# Patient Record
Sex: Female | Born: 1970 | Hispanic: Yes | Smoking: Former smoker
Health system: Southern US, Community
[De-identification: ages and names within clinical notes are randomized; demographics above are authoritative.]

## PROBLEM LIST (undated history)

## (undated) DIAGNOSIS — G43909 Migraine, unspecified, not intractable, without status migrainosus: Secondary | ICD-10-CM

## (undated) DIAGNOSIS — E282 Polycystic ovarian syndrome: Secondary | ICD-10-CM

## (undated) DIAGNOSIS — E785 Hyperlipidemia, unspecified: Secondary | ICD-10-CM

## (undated) DIAGNOSIS — J189 Pneumonia, unspecified organism: Secondary | ICD-10-CM

## (undated) DIAGNOSIS — K760 Fatty (change of) liver, not elsewhere classified: Secondary | ICD-10-CM

## (undated) DIAGNOSIS — K219 Gastro-esophageal reflux disease without esophagitis: Secondary | ICD-10-CM

## (undated) DIAGNOSIS — D649 Anemia, unspecified: Secondary | ICD-10-CM

## (undated) DIAGNOSIS — M1990 Unspecified osteoarthritis, unspecified site: Secondary | ICD-10-CM

## (undated) DIAGNOSIS — K759 Inflammatory liver disease, unspecified: Secondary | ICD-10-CM

## (undated) HISTORY — DX: Anemia, unspecified: D64.9

## (undated) HISTORY — DX: Gastro-esophageal reflux disease without esophagitis: K21.9

## (undated) HISTORY — DX: Unspecified osteoarthritis, unspecified site: M19.90

## (undated) HISTORY — DX: Hyperlipidemia, unspecified: E78.5

## (undated) HISTORY — PX: BREAST BIOPSY: SHX20

## (undated) HISTORY — DX: Migraine, unspecified, not intractable, without status migrainosus: G43.909

## (undated) HISTORY — PX: BREAST EXCISIONAL BIOPSY: SUR124

## (undated) HISTORY — PX: TUBAL LIGATION: SHX77

## (undated) HISTORY — PX: SALPINGECTOMY: SHX328

---

## 2007-06-08 DIAGNOSIS — A159 Respiratory tuberculosis unspecified: Secondary | ICD-10-CM

## 2007-06-08 HISTORY — DX: Respiratory tuberculosis unspecified: A15.9

## 2011-03-17 ENCOUNTER — Other Ambulatory Visit: Payer: Self-pay | Admitting: Family Medicine

## 2011-03-17 DIAGNOSIS — D249 Benign neoplasm of unspecified breast: Secondary | ICD-10-CM

## 2011-03-17 DIAGNOSIS — N63 Unspecified lump in unspecified breast: Secondary | ICD-10-CM

## 2011-03-31 ENCOUNTER — Ambulatory Visit
Admission: RE | Admit: 2011-03-31 | Discharge: 2011-03-31 | Disposition: A | Discharge status: Home / Self Care | Payer: BC Managed Care – PPO | Source: Ambulatory Visit | Attending: Family Medicine | Admitting: Family Medicine

## 2011-03-31 ENCOUNTER — Other Ambulatory Visit: Payer: Self-pay | Admitting: Family Medicine

## 2011-03-31 DIAGNOSIS — D249 Benign neoplasm of unspecified breast: Secondary | ICD-10-CM

## 2011-03-31 DIAGNOSIS — N63 Unspecified lump in unspecified breast: Secondary | ICD-10-CM

## 2011-04-01 ENCOUNTER — Other Ambulatory Visit: Payer: Self-pay | Admitting: Family Medicine

## 2011-04-01 DIAGNOSIS — N63 Unspecified lump in unspecified breast: Secondary | ICD-10-CM

## 2011-04-01 DIAGNOSIS — D249 Benign neoplasm of unspecified breast: Secondary | ICD-10-CM

## 2011-04-02 ENCOUNTER — Ambulatory Visit
Admission: RE | Admit: 2011-04-02 | Discharge: 2011-04-02 | Disposition: A | Discharge status: Home / Self Care | Payer: BC Managed Care – PPO | Source: Ambulatory Visit | Attending: Family Medicine | Admitting: Family Medicine

## 2011-04-02 DIAGNOSIS — N63 Unspecified lump in unspecified breast: Secondary | ICD-10-CM

## 2011-04-02 DIAGNOSIS — D249 Benign neoplasm of unspecified breast: Secondary | ICD-10-CM

## 2012-02-28 ENCOUNTER — Other Ambulatory Visit: Payer: Self-pay | Admitting: Family Medicine

## 2012-02-28 DIAGNOSIS — N63 Unspecified lump in unspecified breast: Secondary | ICD-10-CM

## 2012-03-16 ENCOUNTER — Other Ambulatory Visit: Payer: Self-pay | Admitting: Family Medicine

## 2012-03-16 DIAGNOSIS — G9389 Other specified disorders of brain: Secondary | ICD-10-CM

## 2012-03-20 ENCOUNTER — Ambulatory Visit
Admission: RE | Admit: 2012-03-20 | Discharge: 2012-03-20 | Disposition: A | Discharge status: Home / Self Care | Payer: BC Managed Care – PPO | Source: Ambulatory Visit | Attending: Family Medicine | Admitting: Family Medicine

## 2012-03-20 ENCOUNTER — Other Ambulatory Visit: Payer: Self-pay | Admitting: Family Medicine

## 2012-03-20 DIAGNOSIS — G9389 Other specified disorders of brain: Secondary | ICD-10-CM

## 2012-03-31 ENCOUNTER — Ambulatory Visit
Admission: RE | Admit: 2012-03-31 | Discharge: 2012-03-31 | Disposition: A | Discharge status: Home / Self Care | Payer: BC Managed Care – PPO | Source: Ambulatory Visit | Attending: Family Medicine | Admitting: Family Medicine

## 2012-03-31 DIAGNOSIS — N63 Unspecified lump in unspecified breast: Secondary | ICD-10-CM

## 2013-02-21 ENCOUNTER — Other Ambulatory Visit: Payer: Self-pay | Admitting: Family Medicine

## 2013-02-21 ENCOUNTER — Other Ambulatory Visit (HOSPITAL_COMMUNITY)
Admission: RE | Admit: 2013-02-21 | Discharge: 2013-02-21 | Disposition: A | Discharge status: Home / Self Care | Payer: BC Managed Care – PPO | Source: Ambulatory Visit | Attending: Family Medicine | Admitting: Family Medicine

## 2013-02-21 DIAGNOSIS — Z01419 Encounter for gynecological examination (general) (routine) without abnormal findings: Secondary | ICD-10-CM | POA: Insufficient documentation

## 2013-05-23 ENCOUNTER — Other Ambulatory Visit: Payer: Self-pay | Admitting: Family Medicine

## 2013-05-23 DIAGNOSIS — D242 Benign neoplasm of left breast: Secondary | ICD-10-CM

## 2013-06-12 ENCOUNTER — Ambulatory Visit
Admission: RE | Admit: 2013-06-12 | Discharge: 2013-06-12 | Disposition: A | Discharge status: Home / Self Care | Payer: BC Managed Care – PPO | Source: Ambulatory Visit | Attending: Family Medicine | Admitting: Family Medicine

## 2013-06-12 ENCOUNTER — Ambulatory Visit
Admission: RE | Admit: 2013-06-12 | Discharge: 2013-06-12 | Disposition: A | Discharge status: Home / Self Care | Payer: BLUE CROSS/BLUE SHIELD | Source: Ambulatory Visit | Attending: Family Medicine | Admitting: Family Medicine

## 2013-06-12 DIAGNOSIS — D242 Benign neoplasm of left breast: Secondary | ICD-10-CM

## 2014-05-16 ENCOUNTER — Other Ambulatory Visit: Payer: Self-pay | Admitting: Family Medicine

## 2014-05-16 DIAGNOSIS — D242 Benign neoplasm of left breast: Secondary | ICD-10-CM

## 2014-06-14 ENCOUNTER — Ambulatory Visit
Admission: RE | Admit: 2014-06-14 | Discharge: 2014-06-14 | Disposition: A | Discharge status: Home / Self Care | Payer: BLUE CROSS/BLUE SHIELD | Source: Ambulatory Visit | Attending: Family Medicine | Admitting: Family Medicine

## 2014-06-14 ENCOUNTER — Other Ambulatory Visit: Payer: Self-pay | Admitting: Family Medicine

## 2014-06-14 DIAGNOSIS — D242 Benign neoplasm of left breast: Secondary | ICD-10-CM

## 2014-06-15 ENCOUNTER — Emergency Department (HOSPITAL_BASED_OUTPATIENT_CLINIC_OR_DEPARTMENT_OTHER)
Admission: EM | Admit: 2014-06-15 | Discharge: 2014-06-15 | Disposition: A | Discharge status: Home / Self Care | Payer: No Typology Code available for payment source | Attending: Emergency Medicine | Admitting: Emergency Medicine

## 2014-06-15 ENCOUNTER — Emergency Department (HOSPITAL_BASED_OUTPATIENT_CLINIC_OR_DEPARTMENT_OTHER): Payer: No Typology Code available for payment source

## 2014-06-15 ENCOUNTER — Encounter (HOSPITAL_BASED_OUTPATIENT_CLINIC_OR_DEPARTMENT_OTHER): Payer: Self-pay | Admitting: Emergency Medicine

## 2014-06-15 DIAGNOSIS — M7531 Calcific tendinitis of right shoulder: Secondary | ICD-10-CM

## 2014-06-15 DIAGNOSIS — S4991XA Unspecified injury of right shoulder and upper arm, initial encounter: Secondary | ICD-10-CM | POA: Diagnosis not present

## 2014-06-15 DIAGNOSIS — S20211A Contusion of right front wall of thorax, initial encounter: Secondary | ICD-10-CM | POA: Insufficient documentation

## 2014-06-15 DIAGNOSIS — S0083XA Contusion of other part of head, initial encounter: Secondary | ICD-10-CM | POA: Diagnosis not present

## 2014-06-15 DIAGNOSIS — Y9389 Activity, other specified: Secondary | ICD-10-CM | POA: Diagnosis not present

## 2014-06-15 DIAGNOSIS — Y998 Other external cause status: Secondary | ICD-10-CM | POA: Insufficient documentation

## 2014-06-15 DIAGNOSIS — S60932A Unspecified superficial injury of left thumb, initial encounter: Secondary | ICD-10-CM | POA: Diagnosis not present

## 2014-06-15 DIAGNOSIS — Y92410 Unspecified street and highway as the place of occurrence of the external cause: Secondary | ICD-10-CM | POA: Insufficient documentation

## 2014-06-15 DIAGNOSIS — S299XXA Unspecified injury of thorax, initial encounter: Secondary | ICD-10-CM | POA: Diagnosis present

## 2014-06-15 MED ORDER — ONDANSETRON 4 MG PO TBDP
4.0000 mg | ORAL_TABLET | Freq: Once | ORAL | Status: AC
  Administered 2014-06-15: 4 mg via ORAL
  Filled 2014-06-15: qty 1

## 2014-06-15 MED ORDER — MORPHINE SULFATE 4 MG/ML IJ SOLN
4.0000 mg | Freq: Once | INTRAMUSCULAR | Status: AC
  Administered 2014-06-15: 4 mg via INTRAMUSCULAR
  Filled 2014-06-15: qty 1

## 2014-06-15 MED ORDER — OXYCODONE-ACETAMINOPHEN 5-325 MG PO TABS: ORAL_TABLET | ORAL | Status: DC
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
## 2014-06-15 NOTE — Discharge Instructions (Signed)
Only use the arm sling for up to 2 days. Take the arm out and rotate the shoulder every 4 hours.   Take percocet for breakthrough pain, do not drink alcohol, drive, care for children or do other critical tasks while taking percocet.   It is very important that you take deep breaths to prevent lung collapse and infection.  Take 10 deep breaths every hour to prevent lung collapse.  If you develop cough, fever or shortness of breath return immediately to the emergency room.  Do not hesitate to return to the emergency room for any new, worsening or concerning symptoms.  Please obtain primary care using resource guide below. But the minute you were seen in the emergency room and that they will need to obtain records for further outpatient management.   Calcific Tendinitis Calcific tendinitis occurs when crystals of calcium are deposited in a tendon. Tendons are bands of strong, fibrous tissue that attach muscles to bones. Tendons are an important part of joints. They make the joint move and they absorb some of the stress that a joint receives during use. When calcium is deposited in the tendon, the tendon becomes stiff, painful, and it can become swollen. Calcific tendinitis occurs frequently in the shoulder joint, in a structure called the rotator cuff. CAUSES  The cause of calcific tendinitis is unclear. It may be associated with:  Overuse of the tendon, such as from repetitive motion.  Excess stress on the tendon.  Aging.  Repetitive, mild injuries. SYMPTOMS   Pain may or may not be present. If it is present, it may occur when moving the joint.  Tenderness when pressure is applied to the tendon.  A snapping or popping sound when the joint moves.  Decreased motion of the joint.  Difficulty sleeping due to pain in the joint. DIAGNOSIS  Your health care provider will perform a physical exam. Imaging tests may also be used to make the diagnosis. These may include X-rays, an MRI, or a  CT scan. TREATMENT  Generally, calcific tendinitis resolves on its own. Treatment for pain of calcific tendinitis may include:  Taking over-the-counter medicines, such as anti-inflammatory drugs.  Applying ice packs to the joint.  Following a specific exercise program to keep the joint working properly.  Attending physical therapy sessions.  Avoiding activities that cause pain. Treatment for more severe calcific tendinitis may require:  Injecting cortisone steroids or pain relieving medicines into or around the joint.  Manipulating the joint after you are given medicine to numb the area (local anesthetic).  Inflating the joint with sterile fluid to increase the flexibility of the tendons.  Shockwave therapy, which involves focusing sound waves on the joint. If other treatments do not work, surgery may be done to clean out the calcium deposits and repair the tendons where needed. Most people do not need surgery. HOME CARE INSTRUCTIONS   Only take over-the-counter or prescription medicines for pain, fever, or discomfort as directed by your health care provider.  Follow your health care provider's recommendations for activity and exercise. SEEK MEDICAL CARE IF:  You notice an increase in pain or numbness.  You develop new weakness.  You notice increased joint stiffness or a sensation of looseness in the joint.  You notice increasing redness, swelling, or warmth around the joint area. SEEK IMMEDIATE MEDICAL CARE IF:  You have a fever or persistent symptoms for more than 2 to 3 days.  You have a fever and your symptoms suddenly get worse. MAKE SURE YOU:  Understand these instructions.  Will watch your condition.  Will get help right away if you are not doing well or get worse. Document Released: 03/02/2008 Document Revised: 10/08/2013 Document Reviewed: 09/02/2011 [facility] Patient Information 2015 [place], [place]. This information is not intended to replace advice given  to you by your health care provider. Make sure you discuss any questions you have with your health care provider.   Emergency Department Resource Guide 1) Find a Doctor and Pay Out of Pocket Although you won't have to find out who is covered by your insurance plan, it is a good idea to ask around and get recommendations. You will then need to call the office and see if the doctor you have chosen will accept you as a new patient and what types of options they offer for patients who are self-pay. Some doctors offer discounts or will set up payment plans for their patients who do not have insurance, but you will need to ask so you aren't surprised when you get to your appointment.  2) Contact Your Local Health Department Not all health departments have doctors that can see patients for sick visits, but many do, so it is worth a call to see if yours does. If you don't know where your local health department is, you can check in your phone book. The CDC also has a tool to help you locate your state's health department, and many state websites also have listings of all of their local health departments.  3) Find a [facility] If your illness is not likely to be very severe or complicated, you may want to try a walk in clinic. These are popping up all over the country in pharmacies, drugstores, and shopping centers. They're usually staffed by nurse practitioners or physician assistants that have been trained to treat common illnesses and complaints. They're usually fairly quick and inexpensive. However, if you have serious medical issues or chronic medical problems, these are probably not your best option.  No Primary Care Doctor: - Call Health Connect at  [phone] - they can help you locate a primary care doctor that  accepts your insurance, provides certain services, etc. - Physician Referral Service- [phone]  Chronic Pain Problems: Organization         Address  Phone   Notes  [facility]  [phone] Patients need to be referred by their primary care doctor.   Medication Assistance: Organization         Address  Phone   Notes  [facility] Medication [facility] [address] [place], [place] [address] [phone] --Must be a resident of [facility] -- Must have NO insurance coverage whatsoever (no Medicaid/ Medicare, etc.) -- The pt. MUST have a primary care doctor that directs their care regularly and follows them in the community   [facility]ist  [phone]   [employer]  [phone]    Agencies that provide inexpensive medical care: Organization         Address  Phone   Notes  [place]  [phone]   [facility]    [phone]   [facility] [place], [place] [address] [phone]   [place]on [place] [phone]   Planned Parenthood    [phone]   [facility]    [phone]   Community Health and [facility]  [address], [place] Phone:  [phone], Fax:  [phone] Hours of Operation:  9 am - 6 pm, M-F.  Also accepts Medicaid/Medicare and self-pay.  [facility] for [place], [address], [place] Phone: [phone], Fax: [phone]. Hours of Operation:  8:30 am - 5:30 pm, M-F.  Also accepts Medicaid and self-pay.  [facility] [address], [place] Phone: [phone]   [place], [place], [place] [phone], Ext. [phone] Mondays & Thursdays: 7-9 AM.  First 15 patients are seen on a first come, first serve basis.    [place] Providers:  Organization         Address  Phone   Notes  [facility] [address], [place] [phone] Also accepts self-pay patients.  [facility] [address], [place]  [phone]   [place], [address], [place] [phone]   [facility] [address] [place] [phone]   Lucianne Lei [address], [place]   [phone] Only accepts [place] Access [place] patients after they have their name applied to their card.   Self-Pay (no insurance) in [facility]:  Organization         Address  Phone   Notes  Sickle Cell Patients, [facility] [place] [phone]   [facility] [place] [phone]   [facility] [place]  [place], [place], [place] [phone]   [facility]/Dr. Osei-Bonsu  [address], [place] or [address], [place] [phone] Phone number for both [place] and [place] locations is the same.  [facility] [address], [place] [phone]   [facility] [address], [place] or [address] [phone]   [facility] [address] [place] [phone], phone; [phone], fax Sees patients 1st and 3rd Saturday of every month.  Must not qualify for public or private insurance (i.e. Medicaid, Medicare, [place]  Health Choice, Veterans' Benefits)  Household income should be no more than 200% of the poverty level The clinic cannot treat you if you are pregnant or think you are pregnant  Sexually transmitted diseases are not treated at the clinic.    Dental Care: Organization         Address  Phone  Notes  [facility] [place] [phone] Accepts children up to age 24 who are enrolled in [place] or [place]; pregnant women with a Medicaid card; and children who have applied for Medicaid  or [place] Health Choice, but were declined, whose parents can pay a reduced fee at time of service.  [facility] Department of [facility]  [address], [place] [phone] Accepts children up to age 25 who are enrolled in [place] or [place]; pregnant women with a Medicaid card; and children who have applied for Medicaid or [place] Health Choice, but were declined, whose parents can pay a reduced fee at time of service.  [facility] Adult Dental Access PROGRAM  [place], [place] [phone]  Patients are seen by appointment only. Walk-ins are not accepted. [place] will see patients 25 years of age and older. Monday - Tuesday (8am-5pm) Most Wednesdays (8:30-5pm) $30 per visit, cash only  [facility] Adult Dental Access PROGRAM  [address], [facility] [phone] Patients are seen by appointment only. Walk-ins are not accepted. [place] will see patients 77 years of age and older. One Wednesday Evening (Monthly: Volunteer Based).  $30 per visit, cash only  [place]  [phone] for adults; Children under age 20, call Graduate Pediatric Dentistry at [phone]. Children aged 80-14, please call [phone] to request a pediatric application.  Dental services are provided in all areas of dental care including fillings, crowns and bridges, complete and partial dentures, implants, gum treatment, root canals, and extractions. Preventive care is also provided. Treatment is provided to both adults and children. Patients are selected via a lottery and there is often a waiting list.   [facility] [address], [place]  [phone] [email or web address]   [facility] [address] [place], [place] [phone], Ext. [phone] Second and Fourth Thursday of each month, opens at 6:30 AM; Clinic ends at 9 AM.  Patients are seen on a first-come first-served basis, and a limited number are seen  during each clinic.   [facility]  [address] Hillard Danker [place], [place] [phone]   Eligibility Requirements You must have lived in [place], [place], or [place] counties for at least the last three months.   You cannot be eligible for state or federal sponsored [employer], including [employer], [place], or [employer].   You generally cannot be eligible for healthcare insurance through your employer.    How to apply: Eligibility screenings are held every Tuesday and Wednesday afternoon from 1:00 pm until 4:00 pm. You do not need an appointment for the interview!  [facility] [address], [place], [place]   [place]  [place] Department  [place]  [phone]    Behavioral Health Resources in the Community: Intensive Outpatient Programs Organization         Address  Phone  Notes  [place]. [address] [place], [place] [phone]   [facility] [address], [place], [place]   ADS: Alcohol & Drug Svcs [address], [place], [place]   [place] [address],  [place], [place] or [phone]   Substance Abuse Resources Organization         Address  Phone  Notes  Alcohol and Drug Services  [phone]   [place]  [phone]   [facility] [place]   Chinita Pester  [phone]   Residential & Outpatient Substance Abuse Program  [phone]   Psychological Services Organization         Address  Phone  Notes  [facility] [place]  [place]  [phone]   [place] [address], [place] or [phone]    Mobile Crisis Teams Organization         Address  Phone  Notes  [facility],  Mobile Crisis Care Unit  [phone]   [facility]otherapeutic [facility]  [address] [place], [place]   [facility] [address] [place]  [place] [phone]    Self-Help/Support Groups Organization         Address  Phone             Notes  Mental Health Assoc. of [place] - variety of support groups  [place] Call for more information  Narcotics Anonymous (NA), [facility] [address], [employer] [place]  2 meetings at this location   Special educational needs teacher         Address  Phone  Notes  [facility]AP Residential Treatment [place],    [place]  [phone]   [facility]  [address] [place] [address], [place], [place]   Lydia Tillman, [place] [phone] Admissions: 8am-3pm M-F  Incentives Substance [place] [address],    [place], [place] [phone]   [facility] [address] [place], [place], [place]   [facility] [address]  [place], [place]   [facility] Programs - Intensive Outpatient [address]een [address], [place], [place]   [facility] ([place].) [place].,  [place], [place] [phone] or [phone]   [facility] (RTS) [address] [place], [place] Accepts Medicaid  [facility] [place] [address],  [place] [phone] Substance Abuse/Addiction Treatment   [facility] Organization         Address  Phone  Notes  [facility]  [phone]   Domenic Schwab, PhD [address] Arlis [place], [place]   [phone] or [phone]   [place], [place] [phone]   [facility] [address] [place], [place] [phone] Insurance/Medicaid/sponsorship through [facility] [address]  [address]                                    [place], [place] [phone] [place] [address] [place], [place] [phone]    Dr. Adele Schilder  [phone]   [facility] Dept. 1) [address] [place] 2) [place] 3)  [place] [address], [place] [phone]  [phone]   [place] [phone] or [phone] (After Hours)
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
## 2014-06-19 ENCOUNTER — Other Ambulatory Visit: Payer: Self-pay | Admitting: Family Medicine

## 2014-06-19 DIAGNOSIS — D242 Benign neoplasm of left breast: Secondary | ICD-10-CM

## 2014-06-26 ENCOUNTER — Other Ambulatory Visit: Payer: BLUE CROSS/BLUE SHIELD

## 2014-07-12 ENCOUNTER — Ambulatory Visit
Admission: RE | Admit: 2014-07-12 | Discharge: 2014-07-12 | Disposition: A | Discharge status: Home / Self Care | Payer: BLUE CROSS/BLUE SHIELD | Source: Ambulatory Visit | Attending: Family Medicine | Admitting: Family Medicine

## 2014-07-12 DIAGNOSIS — D242 Benign neoplasm of left breast: Secondary | ICD-10-CM

## 2014-07-24 ENCOUNTER — Other Ambulatory Visit: Payer: Self-pay | Admitting: Family Medicine

## 2014-07-24 DIAGNOSIS — D242 Benign neoplasm of left breast: Secondary | ICD-10-CM

## 2014-08-05 ENCOUNTER — Other Ambulatory Visit: Payer: Self-pay | Admitting: Family Medicine

## 2014-08-05 DIAGNOSIS — G9389 Other specified disorders of brain: Secondary | ICD-10-CM

## 2014-08-12 ENCOUNTER — Ambulatory Visit
Admission: RE | Admit: 2014-08-12 | Discharge: 2014-08-12 | Disposition: A | Discharge status: Home / Self Care | Payer: BLUE CROSS/BLUE SHIELD | Source: Ambulatory Visit | Attending: Family Medicine | Admitting: Family Medicine

## 2014-08-12 DIAGNOSIS — G9389 Other specified disorders of brain: Secondary | ICD-10-CM

## 2014-08-12 MED ORDER — GADOBENATE DIMEGLUMINE 529 MG/ML IV SOLN
13.0000 mL | Freq: Once | INTRAVENOUS | Status: AC | PRN
  Administered 2014-08-12: 13 mL via INTRAVENOUS

## 2015-01-22 ENCOUNTER — Ambulatory Visit
Admission: RE | Admit: 2015-01-22 | Discharge: 2015-01-22 | Disposition: A | Discharge status: Home / Self Care | Payer: 59 | Source: Ambulatory Visit | Attending: Family Medicine | Admitting: Family Medicine

## 2015-01-22 DIAGNOSIS — D242 Benign neoplasm of left breast: Secondary | ICD-10-CM

## 2016-03-23 ENCOUNTER — Other Ambulatory Visit: Payer: Self-pay | Admitting: Family Medicine

## 2016-03-23 DIAGNOSIS — Z1231 Encounter for screening mammogram for malignant neoplasm of breast: Secondary | ICD-10-CM

## 2016-04-07 ENCOUNTER — Ambulatory Visit
Admission: RE | Admit: 2016-04-07 | Discharge: 2016-04-07 | Disposition: A | Discharge status: Home / Self Care | Payer: BLUE CROSS/BLUE SHIELD | Source: Ambulatory Visit | Attending: Family Medicine | Admitting: Family Medicine

## 2016-04-07 DIAGNOSIS — Z1231 Encounter for screening mammogram for malignant neoplasm of breast: Secondary | ICD-10-CM

## 2016-04-09 ENCOUNTER — Ambulatory Visit: Payer: 59

## 2016-05-07 ENCOUNTER — Other Ambulatory Visit: Payer: Self-pay | Admitting: Family Medicine

## 2016-05-07 ENCOUNTER — Other Ambulatory Visit (HOSPITAL_COMMUNITY)
Admission: RE | Admit: 2016-05-07 | Discharge: 2016-05-07 | Disposition: A | Discharge status: Home / Self Care | Payer: BLUE CROSS/BLUE SHIELD | Source: Ambulatory Visit | Attending: Family Medicine | Admitting: Family Medicine

## 2016-05-07 DIAGNOSIS — Z124 Encounter for screening for malignant neoplasm of cervix: Secondary | ICD-10-CM | POA: Diagnosis present

## 2016-05-07 DIAGNOSIS — Z1151 Encounter for screening for human papillomavirus (HPV): Secondary | ICD-10-CM | POA: Insufficient documentation

## 2016-05-11 LAB — CYTOLOGY - PAP
Diagnosis: NEGATIVE
HPV: NOT DETECTED

## 2016-05-12 ENCOUNTER — Other Ambulatory Visit: Payer: Self-pay | Admitting: Family Medicine

## 2016-05-12 DIAGNOSIS — G9389 Other specified disorders of brain: Secondary | ICD-10-CM

## 2016-05-23 ENCOUNTER — Other Ambulatory Visit: Payer: BLUE CROSS/BLUE SHIELD

## 2016-05-26 LAB — HEPATIC FUNCTION PANEL
ALT: 69 — AB (ref 7–35)
AST: 31 (ref 13–35)
Alkaline Phosphatase: 106 (ref 25–125)

## 2016-05-26 LAB — BASIC METABOLIC PANEL
BUN: 10 (ref 4–21)
Creatinine: 0.7 (ref 0.5–1.1)
Glucose: 79
Potassium: 4.3 (ref 3.4–5.3)
Sodium: 139 (ref 137–147)

## 2016-05-26 LAB — LIPID PANEL
Cholesterol: 249 — AB (ref 0–200)
HDL: 79 — AB (ref 35–70)
LDL Cholesterol: 146
Triglycerides: 118 (ref 40–160)

## 2016-06-21 ENCOUNTER — Ambulatory Visit: Payer: BLUE CROSS/BLUE SHIELD | Admitting: Neurology

## 2017-03-09 ENCOUNTER — Other Ambulatory Visit: Payer: Self-pay | Admitting: Family Medicine

## 2017-03-09 DIAGNOSIS — Z1231 Encounter for screening mammogram for malignant neoplasm of breast: Secondary | ICD-10-CM

## 2017-03-23 ENCOUNTER — Ambulatory Visit
Admission: RE | Admit: 2017-03-23 | Discharge: 2017-03-23 | Disposition: A | Discharge status: Home / Self Care | Payer: BLUE CROSS/BLUE SHIELD | Source: Ambulatory Visit | Attending: Family Medicine | Admitting: Family Medicine

## 2017-03-23 DIAGNOSIS — Z1231 Encounter for screening mammogram for malignant neoplasm of breast: Secondary | ICD-10-CM

## 2017-05-19 ENCOUNTER — Ambulatory Visit
Admission: RE | Admit: 2017-05-19 | Discharge: 2017-05-19 | Disposition: A | Discharge status: Home / Self Care | Payer: BLUE CROSS/BLUE SHIELD | Source: Ambulatory Visit | Attending: Family Medicine | Admitting: Family Medicine

## 2018-04-13 ENCOUNTER — Other Ambulatory Visit: Payer: Self-pay | Admitting: Family Medicine

## 2018-04-13 DIAGNOSIS — Z1231 Encounter for screening mammogram for malignant neoplasm of breast: Secondary | ICD-10-CM

## 2018-05-25 ENCOUNTER — Ambulatory Visit
Admission: RE | Admit: 2018-05-25 | Discharge: 2018-05-25 | Disposition: A | Discharge status: Home / Self Care | Payer: BLUE CROSS/BLUE SHIELD | Source: Ambulatory Visit | Attending: Family Medicine | Admitting: Family Medicine

## 2018-05-25 DIAGNOSIS — Z1231 Encounter for screening mammogram for malignant neoplasm of breast: Secondary | ICD-10-CM

## 2018-07-24 ENCOUNTER — Encounter: Payer: Self-pay | Admitting: Family Medicine

## 2018-07-24 ENCOUNTER — Telehealth: Payer: Self-pay

## 2018-07-24 ENCOUNTER — Ambulatory Visit (INDEPENDENT_AMBULATORY_CARE_PROVIDER_SITE_OTHER): Payer: BLUE CROSS/BLUE SHIELD | Admitting: Family Medicine

## 2018-07-24 VITALS — BP 122/70 | HR 95 | Ht 66.0 in | Wt 172.0 lb

## 2018-07-24 DIAGNOSIS — Z0000 Encounter for general adult medical examination without abnormal findings: Secondary | ICD-10-CM | POA: Diagnosis not present

## 2018-07-24 DIAGNOSIS — Z131 Encounter for screening for diabetes mellitus: Secondary | ICD-10-CM | POA: Insufficient documentation

## 2018-07-24 DIAGNOSIS — M25562 Pain in left knee: Secondary | ICD-10-CM

## 2018-07-24 DIAGNOSIS — K219 Gastro-esophageal reflux disease without esophagitis: Secondary | ICD-10-CM

## 2018-07-24 DIAGNOSIS — R7989 Other specified abnormal findings of blood chemistry: Secondary | ICD-10-CM

## 2018-07-24 DIAGNOSIS — M25561 Pain in right knee: Secondary | ICD-10-CM | POA: Diagnosis not present

## 2018-07-24 DIAGNOSIS — R945 Abnormal results of liver function studies: Secondary | ICD-10-CM

## 2018-07-24 DIAGNOSIS — Z23 Encounter for immunization: Secondary | ICD-10-CM | POA: Diagnosis not present

## 2018-07-24 DIAGNOSIS — M222X9 Patellofemoral disorders, unspecified knee: Secondary | ICD-10-CM | POA: Insufficient documentation

## 2018-07-24 LAB — URINALYSIS, ROUTINE W REFLEX MICROSCOPIC
Bilirubin Urine: NEGATIVE
Ketones, ur: NEGATIVE
Leukocytes,Ua: NEGATIVE
Nitrite: NEGATIVE
Specific Gravity, Urine: 1.02 (ref 1.000–1.030)
Total Protein, Urine: NEGATIVE
Urine Glucose: NEGATIVE
Urobilinogen, UA: 0.2 (ref 0.0–1.0)
pH: 5.5 (ref 5.0–8.0)

## 2018-07-24 LAB — COMPREHENSIVE METABOLIC PANEL
ALT: 60 U/L — ABNORMAL HIGH (ref 0–35)
AST: 26 U/L (ref 0–37)
Albumin: 4.2 g/dL (ref 3.5–5.2)
Alkaline Phosphatase: 104 U/L (ref 39–117)
BUN: 12 mg/dL (ref 6–23)
CO2: 24 mEq/L (ref 19–32)
Calcium: 9.2 mg/dL (ref 8.4–10.5)
Chloride: 104 mEq/L (ref 96–112)
Creatinine, Ser: 0.73 mg/dL (ref 0.40–1.20)
GFR: 85.4 mL/min (ref >60.00)
Glucose, Bld: 81 mg/dL (ref 70–99)
Potassium: 4.4 mEq/L (ref 3.5–5.1)
Sodium: 138 mEq/L (ref 135–145)
Total Bilirubin: 0.5 mg/dL (ref 0.2–1.2)
Total Protein: 7.1 g/dL (ref 6.0–8.3)

## 2018-07-24 LAB — LIPID PANEL
Cholesterol: 219 mg/dL — ABNORMAL HIGH (ref 0–200)
HDL: 62.8 mg/dL (ref >39.00)
LDL Cholesterol: 136 mg/dL — ABNORMAL HIGH (ref 0–99)
NonHDL: 155.88
Total CHOL/HDL Ratio: 3
Triglycerides: 98 mg/dL (ref 0.0–149.0)
VLDL: 19.6 mg/dL (ref 0.0–40.0)

## 2018-07-24 LAB — CBC
HCT: 38.1 % (ref 36.0–46.0)
Hemoglobin: 12.3 g/dL (ref 12.0–15.0)
MCHC: 32.4 g/dL (ref 30.0–36.0)
MCV: 84.9 fl (ref 78.0–100.0)
Platelets: 404 10*3/uL — ABNORMAL HIGH (ref 150.0–400.0)
RBC: 4.49 Mil/uL (ref 3.87–5.11)
RDW: 15.4 % (ref 11.5–15.5)
WBC: 7.8 10*3/uL (ref 4.0–10.5)

## 2018-07-24 MED ORDER — PANTOPRAZOLE SODIUM 40 MG PO TBEC: 40.0000 mg | DELAYED_RELEASE_TABLET | Freq: Every day | ORAL | 3 refills | Status: DC

## 2018-07-24 NOTE — Telephone Encounter (Signed)
rx for protonix sent to pharmacy. She is to rtc if reflux does not improve within a week of therapy.

## 2018-07-24 NOTE — Telephone Encounter (Signed)
Patient has been having very bad heartburn that has not been improving with tums or Zantac. Is there anything else she can take?

## 2018-07-24 NOTE — Patient Instructions (Signed)

## 2018-07-24 NOTE — Telephone Encounter (Signed)
I called and spoke with patient. We went over message below and she verbalized understanding.

## 2018-07-24 NOTE — Progress Notes (Addendum)
Established Patient Office Visit  Subjective:  Patient ID: Melinda Mccormick, female    DOB: 1970-12-08  Age: 48 y.o. MRN: [id number]  CC:  Chief Complaint  Patient presents with  . Establish Care  . Annual Exam    HPI Melinda Mccormick presents for here for a physical exam.  She is fasting.  Significant past medical history of infertility.  She was able to achieve pregnancy once and unfortunately lost the baby.  Both fallopian tubes have been removed.  In vitro fertilization is her only option.  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  He was treated with 9 months of antituberculosis therapy.  She has been having bilateral knee pain for quite some time.  It is affected her ability to exercise.  Pap and pelvic are up-to-date.  She is status post multiple breast biopsies for fibroadenomas.  She is engaged in currently living with her fianc.  Patient has a history of GERD and has been taking Zantac.  This does not always relieve her symptoms.  Past Medical History:  Diagnosis Date  . Arthritis   . Migraines     Past Surgical History:  Procedure Laterality Date  . BREAST BIOPSY Right   . BREAST BIOPSY Left   . BREAST EXCISIONAL BIOPSY Left     Family History  Problem Relation Age of Onset  . Arthritis Mother   . Diabetes Mother   . Hyperlipidemia Mother   . Hypertension Mother   . Miscarriages / [place] Mother   . Arthritis Father   . Stroke Father   . Asthma Father   . Hyperlipidemia Father   . Miscarriages / Stillbirths Sister   . Asthma Sister   . Cancer Sister   . Arthritis Maternal Grandmother   . Heart attack Maternal Grandmother   . Early death Maternal Grandfather   . Heart attack Maternal Grandfather   . Arthritis Paternal Grandmother   . Heart attack Paternal Grandmother   . Heart attack Paternal Grandfather   . Asthma Sister     Social History   Socioeconomic History  . Marital status: Significant Other    Spouse name: Not on file  . Number of  children: Not on file  . Years of education: Not on file  . Highest education level: Not on file  Occupational History  . Not on file  Social Needs  . Financial resource strain: Not on file  . Food insecurity:    Worry: Not on file    Inability: Not on file  . Transportation needs:    Medical: Not on file    Non-medical: Not on file  Tobacco Use  . Smoking status: Never Smoker  . Smokeless tobacco: Never Used  Substance and Sexual Activity  . Alcohol use: Not on file  . Drug use: Not on file  . Sexual activity: Not on file  Lifestyle  . Physical activity:    Days per week: Not on file    Minutes per session: Not on file  . Stress: Not on file  Relationships  . Social connections:    Talks on phone: Not on file    Gets together: Not on file    Attends religious service: Not on file    Active member of club or organization: Not on file    Attends meetings of clubs or organizations: Not on file    Relationship status: Not on file  . Intimate partner violence:    Fear of current or ex  partner: Not on file    Emotionally abused: Not on file    Physically abused: Not on file    Forced sexual activity: Not on file  Other Topics Concern  . Not on file  Social History Narrative  . Not on file    Outpatient Medications Prior to Visit  Medication Sig Dispense Refill  . oxyCODONE-acetaminophen (PERCOCET/ROXICET) 5-325 MG per tablet 1 to 2 tabs PO q6hrs  PRN for pain 15 tablet 0   No facility-administered medications prior to visit.     No Known Allergies  ROS Review of Systems  Constitutional: Negative for chills, diaphoresis, fatigue, fever and unexpected weight change.  HENT: Negative.   Eyes: Negative for photophobia and visual disturbance.  Respiratory: Negative.   Cardiovascular: Negative.   Gastrointestinal: Negative.   Endocrine: Negative for polyphagia and polyuria.  Genitourinary: Negative.   Musculoskeletal: Negative.   Skin: Negative.     Allergic/Immunologic: Negative for immunocompromised state.  Neurological: Negative for light-headedness, numbness and headaches.  Hematological: Does not bruise/bleed easily.  Psychiatric/Behavioral: Negative.       Objective:    Physical Exam  Constitutional: She is oriented to person, place, and time. She appears well-developed and well-nourished. No distress.  HENT:  Head: Normocephalic and atraumatic.  Right Ear: External ear normal.  Left Ear: External ear normal.  Mouth/Throat: Oropharynx is clear and moist. No oropharyngeal exudate.  Eyes: Pupils are equal, round, and reactive to light. Conjunctivae are normal. Right eye exhibits no discharge. Left eye exhibits no discharge. No scleral icterus.  Neck: Neck supple. No JVD present. No tracheal deviation present. No thyromegaly present.  Cardiovascular: Normal rate, regular rhythm and normal heart sounds.  Pulmonary/Chest: Effort normal and breath sounds normal. No stridor.  Abdominal: Bowel sounds are normal.  Lymphadenopathy:    She has no cervical adenopathy.  Neurological: She is alert and oriented to person, place, and time.  Skin: Skin is warm and dry. She is not diaphoretic.  Psychiatric: She has a normal mood and affect. Her behavior is normal.    BP 122/70   Pulse 95   Ht 5\' 6"  (1.676 m)   Wt 172 lb (78 kg)   SpO2 96%   BMI 27.76 kg/m  Wt Readings from Last 3 Encounters:  07/24/18 172 lb (78 kg)  08/12/14 140 lb (63.5 kg)  06/15/14 135 lb (61.2 kg)   BP Readings from Last 3 Encounters:  07/24/18 122/70  06/15/14 113/85   Guideline developer:  UpToDate (see UpToDate for funding source) Date Released: June 2014  Health Maintenance Due  Topic Date Due  . HIV Screening  05/28/1986  . TETANUS/TDAP  05/28/1990    There are no preventive care reminders to display for this patient.  Lab Results  Component Value Date   TSH 1.50 07/24/2018   Lab Results  Component Value Date   WBC 7.8 07/24/2018    HGB 12.3 07/24/2018   HCT 38.1 07/24/2018   MCV 84.9 07/24/2018   PLT 404.0 (H) 07/24/2018   Lab Results  Component Value Date   NA 138 07/24/2018   K 4.4 07/24/2018   CO2 24 07/24/2018   GLUCOSE 81 07/24/2018   BUN 12 07/24/2018   CREATININE 0.73 07/24/2018   BILITOT 0.5 07/24/2018   ALKPHOS 104 07/24/2018   AST 26 07/24/2018   ALT 60 (H) 07/24/2018   PROT 7.1 07/24/2018   ALBUMIN 4.2 07/24/2018   CALCIUM 9.2 07/24/2018   GFR 85.40 07/24/2018   Lab Results  Component Value Date   CHOL 219 (H) 07/24/2018   Lab Results  Component Value Date   HDL 62.80 07/24/2018   Lab Results  Component Value Date   LDLCALC 136 (H) 07/24/2018   Lab Results  Component Value Date   TRIG 98.0 07/24/2018   Lab Results  Component Value Date   CHOLHDL 3 07/24/2018   No results found for: HGBA1C    Assessment & Plan:   Problem List Items Addressed This Visit      Digestive   Gastroesophageal reflux disease   Relevant Medications   pantoprazole (PROTONIX) 40 MG tablet     Other   Healthcare maintenance - Primary   Relevant Orders   Lipid panel (Completed)   Comprehensive metabolic panel (Completed)   CBC (Completed)   TSH (Completed)   Urinalysis, Routine w reflex microscopic (Completed)   Need for immunization against influenza   Relevant Orders   Flu Vaccine QUAD 36+ mos IM (Completed)   Pain in both knees   Relevant Orders   Ambulatory referral to Sports Medicine   Elevated LFTs   Relevant Orders   Gamma GT   Hepatic function panel      Meds ordered this encounter  Medications  . pantoprazole (PROTONIX) 40 MG tablet    Sig: Take 1 tablet (40 mg total) by mouth daily.    Dispense:  30 tablet    Refill:  3    Follow-up: Return in about 2 months (around 09/22/2018).   Patient would prefer to see a female for her pelvic and Pap smear.  She will let me know about the intracranial mass that is been followed by serial MRI scans.  Follow-up will be in 3  months

## 2018-07-24 NOTE — Addendum Note (Signed)
Addended by: Jon Billings on: 07/24/2018 03:58 PM   Modules accepted: Orders

## 2018-07-26 DIAGNOSIS — R945 Abnormal results of liver function studies: Secondary | ICD-10-CM

## 2018-07-26 DIAGNOSIS — R7989 Other specified abnormal findings of blood chemistry: Secondary | ICD-10-CM | POA: Insufficient documentation

## 2018-07-26 LAB — TSH: TSH: 1.5 u[IU]/mL (ref 0.35–4.50)

## 2018-07-26 NOTE — Addendum Note (Signed)
Addended by: Jon Billings on: 07/26/2018 09:28 AM   Modules accepted: Orders

## 2018-07-27 ENCOUNTER — Ambulatory Visit (INDEPENDENT_AMBULATORY_CARE_PROVIDER_SITE_OTHER): Payer: BLUE CROSS/BLUE SHIELD | Admitting: Family Medicine

## 2018-07-27 ENCOUNTER — Encounter: Payer: Self-pay | Admitting: Family Medicine

## 2018-07-27 ENCOUNTER — Ambulatory Visit (INDEPENDENT_AMBULATORY_CARE_PROVIDER_SITE_OTHER): Payer: BLUE CROSS/BLUE SHIELD

## 2018-07-27 VITALS — BP 130/90 | HR 99 | Temp 98.0°F | Ht 66.0 in | Wt 172.8 lb

## 2018-07-27 DIAGNOSIS — R7989 Other specified abnormal findings of blood chemistry: Secondary | ICD-10-CM

## 2018-07-27 DIAGNOSIS — M25561 Pain in right knee: Secondary | ICD-10-CM

## 2018-07-27 DIAGNOSIS — M222X2 Patellofemoral disorders, left knee: Secondary | ICD-10-CM

## 2018-07-27 DIAGNOSIS — R945 Abnormal results of liver function studies: Secondary | ICD-10-CM | POA: Diagnosis not present

## 2018-07-27 DIAGNOSIS — M222X1 Patellofemoral disorders, right knee: Secondary | ICD-10-CM

## 2018-07-27 DIAGNOSIS — M25562 Pain in left knee: Secondary | ICD-10-CM

## 2018-07-27 LAB — HEPATIC FUNCTION PANEL
ALT: 36 U/L — ABNORMAL HIGH (ref 0–35)
AST: 18 U/L (ref 0–37)
Albumin: 4.4 g/dL (ref 3.5–5.2)
Alkaline Phosphatase: 92 U/L (ref 39–117)
Bilirubin, Direct: 0.1 mg/dL (ref 0.0–0.3)
Total Bilirubin: 0.4 mg/dL (ref 0.2–1.2)
Total Protein: 7.1 g/dL (ref 6.0–8.3)

## 2018-07-27 LAB — GAMMA GT: GGT: 128 U/L — ABNORMAL HIGH (ref 7–51)

## 2018-07-27 MED ORDER — DICLOFENAC SODIUM 2 % TD SOLN: 1.0000 "application " | Freq: Two times a day (BID) | TRANSDERMAL | 3 refills | Status: DC

## 2018-07-27 NOTE — Progress Notes (Signed)
Melinda Mccormick - 48 y.o. female MRN [id number]  Date of birth: 10-Mar-1971  SUBJECTIVE:  Including CC & ROS.  Chief Complaint  Patient presents with  . Knee Pain    pt here for bilateral knee pain. pt sts she has been having this pain for 2 years, but within the last years has gotten worse. pt sts knee will alternate with worsening pain.     Melinda Mccormick is a 48 y.o. female that is presenting with bilateral knee pain is acute on chronic and has been worsening.  Feels the pain anteriorly.  She sits most of that work.  Denies any mechanical symptoms.  Does endorse crepitus.  Pain is mild to moderate.  Is localized to the knees.  The left is worse than the right.  Has not had any formal physical therapy.  No improvement with medications.  No inciting event or trauma.  Worse with going up and down stairs or rising from a seated position.   Review of Systems  Constitutional: Negative for fever.  HENT: Negative for congestion.   Respiratory: Negative for cough.   Cardiovascular: Negative for chest pain.  Gastrointestinal: Negative for abdominal distention.  Musculoskeletal: Negative for joint swelling.  Skin: Negative for color change.  Neurological: Negative for weakness.  Hematological: Negative for adenopathy.  Psychiatric/Behavioral: Negative for agitation.    HISTORY: Past Medical, Surgical, Social, and Family History Reviewed & Updated per EMR.   Pertinent Historical Findings include:  Past Medical History:  Diagnosis Date  . Arthritis   . Migraines     Past Surgical History:  Procedure Laterality Date  . BREAST BIOPSY Right   . BREAST BIOPSY Left   . BREAST EXCISIONAL BIOPSY Left     No Known Allergies  Family History  Problem Relation Age of Onset  . Arthritis Mother   . Diabetes Mother   . Hyperlipidemia Mother   . Hypertension Mother   . Miscarriages / [place] Mother   . Arthritis Father   . Stroke Father   . Asthma Father   . Hyperlipidemia Father   .  Miscarriages / Stillbirths Sister   . Asthma Sister   . Cancer Sister   . Arthritis Maternal Grandmother   . Heart attack Maternal Grandmother   . Early death Maternal Grandfather   . Heart attack Maternal Grandfather   . Arthritis Paternal Grandmother   . Heart attack Paternal Grandmother   . Heart attack Paternal Grandfather   . Asthma Sister      Social History   Socioeconomic History  . Marital status: Significant Other    Spouse name: Not on file  . Number of children: Not on file  . Years of education: Not on file  . Highest education level: Not on file  Occupational History  . Not on file  Social Needs  . Financial resource strain: Not on file  . Food insecurity:    Worry: Not on file    Inability: Not on file  . Transportation needs:    Medical: Not on file    Non-medical: Not on file  Tobacco Use  . Smoking status: Never Smoker  . Smokeless tobacco: Never Used  Substance and Sexual Activity  . Alcohol use: Not on file  . Drug use: Not on file  . Sexual activity: Not on file  Lifestyle  . Physical activity:    Days per week: Not on file    Minutes per session: Not on file  . Stress: Not on file  Relationships  . Social connections:    Talks on phone: Not on file    Gets together: Not on file    Attends religious service: Not on file    Active member of club or organization: Not on file    Attends meetings of clubs or organizations: Not on file    Relationship status: Not on file  . Intimate partner violence:    Fear of current or ex partner: Not on file    Emotionally abused: Not on file    Physically abused: Not on file    Forced sexual activity: Not on file  Other Topics Concern  . Not on file  Social History Narrative  . Not on file     PHYSICAL EXAM:  VS: BP 130/90   Pulse 99   Temp 98 F (36.7 C) (Oral)   Ht 5\' 6"  (1.676 m)   Wt 172 lb 12.8 oz (78.4 kg)   SpO2 98%   BMI 27.89 kg/m  Physical Exam Gen: NAD, alert, cooperative with  exam, well-appearing ENT: normal lips, normal nasal mucosa,  Eye: normal EOM, normal conjunctiva and lids CV:  no edema, +2 pedal pulses   Resp: no accessory muscle use, non-labored,  Skin: no rashes, no areas of induration  Neuro: normal tone, normal sensation to touch Psych:  normal insight, alert and oriented MSK:  Left and right knee: No obvious effusion. No tenderness to palpation of the medial lateral joint line. No tenderness palpation of the quad or patellar tendon. Normal range of motion. Normal strength resistance. No instability with valgus or varus stress testing. Negative McMurray's test. No pain with patellar grind. Neurovascular intact  Limited ultrasound: Left and right knee:  Left knee: No effusion in the suprapatellar pouch.  Possible that the synovium is enlarged and inflamed to suggest a synovitis. Normal-appearing quad and patellar tendon. Normal-appearing medial lateral meniscus.  Right knee: Similar findings to the left knee with an enlargement suggested possible synovitis of the synovium but no identifiable effusion. Medial joint line with possible degenerative meniscal tear.   Summary: No degenerative findings but possible synovitis bilaterally  Ultrasound and interpretation by Clearance Coots, MD      ASSESSMENT & PLAN:   Patellofemoral syndrome Suspect pain seems reminiscent of patellofemoral syndrome with weakness with her hip abduction.  Possible to have a synovitis with findings on ultrasound but will treat for biomechanical symptoms initially. -Provided Pennsaid. -Counseled on home exercise therapy and supportive care. -If no improvement can consider lab work, imaging, or physical therapy.

## 2018-07-27 NOTE — Assessment & Plan Note (Signed)
Suspect pain seems reminiscent of patellofemoral syndrome with weakness with her hip abduction.  Possible to have a synovitis with findings on ultrasound but will treat for biomechanical symptoms initially. -Provided Pennsaid. -Counseled on home exercise therapy and supportive care. -If no improvement can consider lab work, imaging, or physical therapy.

## 2018-07-27 NOTE — Patient Instructions (Signed)
Nice to meet you  Please try the exercises  Please try the rub on medicine  Please try ice if needed Please see me back in 4 weeks if no better.

## 2018-08-03 ENCOUNTER — Encounter: Payer: Self-pay | Admitting: Family Medicine

## 2018-08-04 ENCOUNTER — Encounter: Payer: Self-pay | Admitting: Family Medicine

## 2018-08-04 ENCOUNTER — Telehealth: Payer: Self-pay

## 2018-08-04 NOTE — Telephone Encounter (Signed)
Pt was seen by JS and a medication was sent [place] a [place] pharmacy/she has a separate prescription benefit card that we do not have/pt was advised that she will need to bring [place] the card for [place] to make a copy and fax it to the pharmacy/thx dmf

## 2018-08-07 ENCOUNTER — Encounter: Payer: Self-pay | Admitting: Family Medicine

## 2018-08-15 ENCOUNTER — Ambulatory Visit: Payer: BLUE CROSS/BLUE SHIELD | Admitting: Family Medicine

## 2018-08-23 ENCOUNTER — Telehealth: Payer: Self-pay

## 2018-08-23 NOTE — Telephone Encounter (Signed)
Questions for Screening COVID-19  Symptom onset: feb 28th she started off with fever of 100 hasn't had fever since this day, Pt family member is Dr. Prescribed some ax taken 5 days worth  Travel or Contacts: no travel,no contact with someone who has traveled or has covid-19  During this illness, did/does the patient experience any of the following symptoms? Fever >100.55F [x]   Yes []   No []   Unknown Subjective fever (felt feverish) []   Yes []   No []   Unknown Chills []   Yes []   No [x]   Unknown Muscle aches (myalgia) []   Yes []   No [x]   Unknown Runny nose (rhinorrhea) [x]   Yes []   No []   Unknown Sore throat [x]   Yes []   No []   Unknown Cough (new onset or worsening of chronic cough) [x]   Yes []   No []   Unknown Shortness of breath (dyspnea) []   Yes [x]   No []   Unknown Nausea or vomiting []   Yes [x]   No []   Unknown Headache []   Yes [x]   No []   Unknown Abdominal pain  []   Yes []   No [x]   Unknown Diarrhea (?3 loose/looser than normal stools/24hr period) []   Yes []   No [x]   Unknown Other, specify:  Patient risk factors: Smoker? []   Current []   Former [x]   Never If female, currently pregnant? []   Yes [x]   No  Patient Active Problem List   Diagnosis Date Noted  . Elevated LFTs 07/26/2018  . Healthcare maintenance 07/24/2018  . Need for immunization against influenza 07/24/2018  . Patellofemoral syndrome 07/24/2018  . Gastroesophageal reflux disease 07/24/2018    Plan:  []   High risk for COVID-19 with red flags go to ED (with CP, SOB, weak/lightheaded, or fever > 101.5). Call ahead.  []   High risk for COVID-19 but stable will have car visit. Inform provider and coordinate time. Will be completed in afternoon. []   No red flags but URI signs or symptoms will go through side door and be seen in dedicated room.  Note: Referral to telemedicine is an appropriate alternative disposition for higher risk but stable. [facility]/e-Visit: [phone].

## 2018-08-24 ENCOUNTER — Other Ambulatory Visit: Payer: Self-pay

## 2018-08-24 ENCOUNTER — Encounter: Payer: Self-pay | Admitting: Family Medicine

## 2018-08-24 ENCOUNTER — Other Ambulatory Visit: Payer: Self-pay | Admitting: Family Medicine

## 2018-08-24 ENCOUNTER — Ambulatory Visit (INDEPENDENT_AMBULATORY_CARE_PROVIDER_SITE_OTHER): Payer: BLUE CROSS/BLUE SHIELD | Admitting: Family Medicine

## 2018-08-24 VITALS — BP 120/70 | HR 98 | Temp 98.6°F | Ht 66.0 in | Wt 172.0 lb

## 2018-08-24 DIAGNOSIS — R7989 Other specified abnormal findings of blood chemistry: Secondary | ICD-10-CM

## 2018-08-24 DIAGNOSIS — R0982 Postnasal drip: Secondary | ICD-10-CM | POA: Diagnosis not present

## 2018-08-24 DIAGNOSIS — R945 Abnormal results of liver function studies: Secondary | ICD-10-CM | POA: Diagnosis not present

## 2018-08-24 LAB — HEPATIC FUNCTION PANEL
ALT: 119 U/L — ABNORMAL HIGH (ref 0–35)
AST: 56 U/L — ABNORMAL HIGH (ref 0–37)
Albumin: 4.6 g/dL (ref 3.5–5.2)
Alkaline Phosphatase: 157 U/L — ABNORMAL HIGH (ref 39–117)
Bilirubin, Direct: 0.1 mg/dL (ref 0.0–0.3)
Total Bilirubin: 0.5 mg/dL (ref 0.2–1.2)
Total Protein: 7.4 g/dL (ref 6.0–8.3)

## 2018-08-24 LAB — GAMMA GT: GGT: 277 U/L — ABNORMAL HIGH (ref 7–51)

## 2018-08-24 MED ORDER — PREDNISONE 10 MG PO TABS: 10.0000 mg | ORAL_TABLET | Freq: Two times a day (BID) | ORAL | 0 refills | Status: AC

## 2018-08-24 MED ORDER — FLUTICASONE PROPIONATE 50 MCG/ACT NA SUSP
2.0000 | Freq: Every day | NASAL | 6 refills | Status: DC
Start: 2018-08-24 — End: 2019-08-31

## 2018-08-24 MED ORDER — DICLOFENAC SODIUM 2 % TD SOLN: 1.0000 "application " | Freq: Two times a day (BID) | TRANSDERMAL | 3 refills | Status: DC

## 2018-08-24 NOTE — Progress Notes (Addendum)
Established Patient Office Visit  Subjective:  Patient ID: Melinda Mccormick, female    DOB: December 08, 1970  Age: 48 y.o. MRN: [id number]  CC:  Chief Complaint  Patient presents with  . Follow-up    HPI Melinda Mccormick presents for treatment and evaluation of a 3-week history of nasal congestion postnasal drip and cough.  There is been no fever chills nausea and vomiting wheezing or history of reactive airway disease.  There is a family history of asthma but patient has not been affected.  Patient has a distant history of tobacco use.  Denies facial pressure or teeth pain or sneezing.  Patient is taking a Z-Pak but the postnasal drip and cough persists. Patient's liver enzymes have been elevated with a substantially elevated GGT.  Patient denies excessive alcohol usage.  When she does drink she has 1 glass of wine and that is it.  Recent lipid profile showed normal triglycerides that were on the low side.  Patient has a history of hepatitis a at age 30.   Past Medical History:  Diagnosis Date  . Arthritis   . Migraines     Past Surgical History:  Procedure Laterality Date  . BREAST BIOPSY Right   . BREAST BIOPSY Left   . BREAST EXCISIONAL BIOPSY Left     Family History  Problem Relation Age of Onset  . Arthritis Mother   . Diabetes Mother   . Hyperlipidemia Mother   . Hypertension Mother   . Miscarriages / [place] Mother   . Arthritis Father   . Stroke Father   . Asthma Father   . Hyperlipidemia Father   . Miscarriages / Stillbirths Sister   . Asthma Sister   . Cancer Sister   . Arthritis Maternal Grandmother   . Heart attack Maternal Grandmother   . Early death Maternal Grandfather   . Heart attack Maternal Grandfather   . Arthritis Paternal Grandmother   . Heart attack Paternal Grandmother   . Heart attack Paternal Grandfather   . Asthma Sister     Social History   Socioeconomic History  . Marital status: Significant Other    Spouse name: Not on file  .  Number of children: Not on file  . Years of education: Not on file  . Highest education level: Not on file  Occupational History  . Not on file  Social Needs  . Financial resource strain: Not on file  . Food insecurity:    Worry: Not on file    Inability: Not on file  . Transportation needs:    Medical: Not on file    Non-medical: Not on file  Tobacco Use  . Smoking status: Never Smoker  . Smokeless tobacco: Never Used  Substance and Sexual Activity  . Alcohol use: Not on file  . Drug use: Not on file  . Sexual activity: Not on file  Lifestyle  . Physical activity:    Days per week: Not on file    Minutes per session: Not on file  . Stress: Not on file  Relationships  . Social connections:    Talks on phone: Not on file    Gets together: Not on file    Attends religious service: Not on file    Active member of club or organization: Not on file    Attends meetings of clubs or organizations: Not on file    Relationship status: Not on file  . Intimate partner violence:    Fear of current or ex partner:  Not on file    Emotionally abused: Not on file    Physically abused: Not on file    Forced sexual activity: Not on file  Other Topics Concern  . Not on file  Social History Narrative  . Not on file    Outpatient Medications Prior to Visit  Medication Sig Dispense Refill  . Ascorbic Acid (VITAMIN C) 1000 MG tablet Take 500 mg by mouth daily.    . Diclofenac Sodium (PENNSAID) 2 % SOLN Place 1 application onto the skin 2 (two) times daily. 1 Bottle 3  . glucosamine-chondroitin 500-400 MG tablet Take 1 tablet by mouth 2 (two) times daily.    Marland Kitchen omega-3 acid ethyl esters (LOVAZA) 1 g capsule Take by mouth 2 (two) times daily.    . pantoprazole (PROTONIX) 40 MG tablet Take 1 tablet (40 mg total) by mouth daily. 30 tablet 3   No facility-administered medications prior to visit.     No Known Allergies  ROS Review of Systems  Constitutional: Negative for chills,  diaphoresis, fatigue, fever and unexpected weight change.  HENT: Positive for congestion and postnasal drip. Negative for rhinorrhea, sinus pressure, sinus pain, sneezing and sore throat.   Eyes: Negative for photophobia and visual disturbance.  Respiratory: Positive for cough. Negative for shortness of breath and wheezing.   Cardiovascular: Negative.   Gastrointestinal: Negative.  Negative for nausea and vomiting.  Endocrine: Negative for polyphagia and polyuria.  Genitourinary: Negative.   Musculoskeletal: Negative for joint swelling.  Allergic/Immunologic: Negative for immunocompromised state.  Neurological: Negative for light-headedness and headaches.  Hematological: Does not bruise/bleed easily.  Psychiatric/Behavioral: Negative.       Objective:    Physical Exam  Constitutional: She is oriented to person, place, and time. She appears well-developed and well-nourished. No distress.  HENT:  Head: Normocephalic and atraumatic.  Right Ear: External ear normal.  Left Ear: External ear normal.  Mouth/Throat: Oropharynx is clear and moist. No oropharyngeal exudate.  Eyes: Pupils are equal, round, and reactive to light. Conjunctivae are normal. Right eye exhibits no discharge. Left eye exhibits no discharge. No scleral icterus.  Neck: Neck supple. No JVD present. No tracheal deviation present. No thyromegaly present.  Cardiovascular: Normal rate, regular rhythm and normal heart sounds.  Pulmonary/Chest: Effort normal and breath sounds normal. No stridor. No respiratory distress. She has no wheezes. She has no rales.  Abdominal: Bowel sounds are normal.  Musculoskeletal:        General: Edema present.  Lymphadenopathy:    She has no cervical adenopathy.  Neurological: She is alert and oriented to person, place, and time.  Skin: Skin is warm and dry. She is not diaphoretic.  Psychiatric: She has a normal mood and affect. Her behavior is normal.    BP 120/70   Pulse 98   Temp 98.6  F (37 C) (Oral)   Ht 5\' 6"  (1.676 m)   Wt 172 lb (78 kg)   SpO2 97%   BMI 27.76 kg/m  Wt Readings from Last 3 Encounters:  08/24/18 172 lb (78 kg)  07/27/18 172 lb 12.8 oz (78.4 kg)  07/24/18 172 lb (78 kg)   BP Readings from Last 3 Encounters:  08/24/18 120/70  07/27/18 130/90  07/24/18 122/70   Guideline developer:  UpToDate (see UpToDate for funding source) Date Released: June 2014  Health Maintenance Due  Topic Date Due  . HIV Screening  05/28/1986  . TETANUS/TDAP  05/28/1990    There are no preventive care reminders to  display for this patient.  Lab Results  Component Value Date   TSH 1.50 07/24/2018   Lab Results  Component Value Date   WBC 7.8 07/24/2018   HGB 12.3 07/24/2018   HCT 38.1 07/24/2018   MCV 84.9 07/24/2018   PLT 404.0 (H) 07/24/2018   Lab Results  Component Value Date   NA 138 07/24/2018   K 4.4 07/24/2018   CO2 24 07/24/2018   GLUCOSE 81 07/24/2018   BUN 12 07/24/2018   CREATININE 0.73 07/24/2018   BILITOT 0.4 07/27/2018   ALKPHOS 92 07/27/2018   AST 18 07/27/2018   ALT 36 (H) 07/27/2018   PROT 7.1 07/27/2018   ALBUMIN 4.4 07/27/2018   CALCIUM 9.2 07/24/2018   GFR 85.40 07/24/2018   Lab Results  Component Value Date   CHOL 219 (H) 07/24/2018   Lab Results  Component Value Date   HDL 62.80 07/24/2018   Lab Results  Component Value Date   LDLCALC 136 (H) 07/24/2018   Lab Results  Component Value Date   TRIG 98.0 07/24/2018   Lab Results  Component Value Date   CHOLHDL 3 07/24/2018   No results found for: HGBA1C    Assessment & Plan:   Problem List Items Addressed This Visit      Other   Elevated LFTs - Primary   Relevant Orders   Hepatitis C antibody   Hepatic function panel   Gamma GT   Hepatitis B Surface AntiGEN   Hepatitis B surface antibody,quantitative   US Abdomen Limited RUQ   HIV Antibody (routine testing w rflx)   Post-nasal drip   Relevant Medications   fluticasone (FLONASE) 50 MCG/ACT nasal  spray   predniSONE (DELTASONE) 10 MG tablet      Meds ordered this encounter  Medications  . fluticasone (FLONASE) 50 MCG/ACT nasal spray    Sig: Place 2 sprays into both nostrils daily.    Dispense:  16 g    Refill:  6  . predniSONE (DELTASONE) 10 MG tablet    Sig: Take 1 tablet (10 mg total) by mouth 2 (two) times daily with a meal for 7 days.    Dispense:  14 tablet    Refill:  0    Follow-up: Patient will follow-up in a week if her congestion and postnasal drip are not improved on above therapy.  Follow-up for elevated liver enzymes will pend the results of the work-up.  3/20 addendum:   Patient will follow-up in 3 weeks to have her hepatic profile and GGT to be repeated.

## 2018-08-25 LAB — HEPATITIS C ANTIBODY
Hepatitis C Ab: NONREACTIVE
SIGNAL TO CUT-OFF: 0.03 (ref <1.00)

## 2018-08-25 LAB — HEPATITIS B SURFACE ANTIGEN: Hepatitis B Surface Ag: NONREACTIVE

## 2018-08-25 LAB — HEPATITIS B SURFACE ANTIBODY, QUANTITATIVE: Hepatitis B-Post: 235 m[IU]/mL (ref >=10)

## 2018-08-25 NOTE — Addendum Note (Signed)
Addended by: Jon Billings on: 08/25/2018 11:56 AM   Modules accepted: Orders

## 2018-08-30 ENCOUNTER — Ambulatory Visit: Payer: BLUE CROSS/BLUE SHIELD | Admitting: Family Medicine

## 2018-09-02 ENCOUNTER — Encounter: Payer: Self-pay | Admitting: Family Medicine

## 2018-10-19 ENCOUNTER — Ambulatory Visit (HOSPITAL_BASED_OUTPATIENT_CLINIC_OR_DEPARTMENT_OTHER)
Admission: RE | Admit: 2018-10-19 | Discharge: 2018-10-19 | Disposition: A | Discharge status: Home / Self Care | Payer: BLUE CROSS/BLUE SHIELD | Source: Ambulatory Visit | Attending: Family Medicine | Admitting: Family Medicine

## 2018-10-19 ENCOUNTER — Other Ambulatory Visit: Payer: Self-pay

## 2018-10-19 DIAGNOSIS — R7989 Other specified abnormal findings of blood chemistry: Secondary | ICD-10-CM | POA: Diagnosis not present

## 2018-10-19 DIAGNOSIS — R945 Abnormal results of liver function studies: Secondary | ICD-10-CM | POA: Insufficient documentation

## 2018-10-20 ENCOUNTER — Encounter: Payer: Self-pay | Admitting: Family Medicine

## 2018-10-20 ENCOUNTER — Other Ambulatory Visit (INDEPENDENT_AMBULATORY_CARE_PROVIDER_SITE_OTHER): Payer: BLUE CROSS/BLUE SHIELD

## 2018-10-20 DIAGNOSIS — R7989 Other specified abnormal findings of blood chemistry: Secondary | ICD-10-CM

## 2018-10-20 DIAGNOSIS — R945 Abnormal results of liver function studies: Secondary | ICD-10-CM

## 2018-10-20 NOTE — Addendum Note (Signed)
Addended by: Lynnea Ferrier on: 10/20/2018 11:59 AM   Modules accepted: Orders

## 2018-10-21 LAB — HEPATIC FUNCTION PANEL
AG Ratio: 1.6 (calc) (ref 1.0–2.5)
ALT: 73 U/L — ABNORMAL HIGH (ref 6–29)
AST: 34 U/L (ref 10–35)
Albumin: 4.6 g/dL (ref 3.6–5.1)
Alkaline phosphatase (APISO): 119 U/L (ref 31–125)
Bilirubin, Direct: 0.1 mg/dL (ref 0.0–0.2)
Globulin: 2.9 g/dL (calc) (ref 1.9–3.7)
Indirect Bilirubin: 0.3 mg/dL (calc) (ref 0.2–1.2)
Total Bilirubin: 0.4 mg/dL (ref 0.2–1.2)
Total Protein: 7.5 g/dL (ref 6.1–8.1)

## 2018-10-21 LAB — GAMMA GT: GGT: 145 U/L — ABNORMAL HIGH (ref 3–55)

## 2018-10-23 ENCOUNTER — Ambulatory Visit (INDEPENDENT_AMBULATORY_CARE_PROVIDER_SITE_OTHER): Payer: BLUE CROSS/BLUE SHIELD | Admitting: Family Medicine

## 2018-10-23 ENCOUNTER — Encounter: Payer: Self-pay | Admitting: Family Medicine

## 2018-10-23 VITALS — Ht 66.0 in

## 2018-10-23 DIAGNOSIS — R945 Abnormal results of liver function studies: Secondary | ICD-10-CM | POA: Diagnosis not present

## 2018-10-23 DIAGNOSIS — R7989 Other specified abnormal findings of blood chemistry: Secondary | ICD-10-CM

## 2018-10-23 NOTE — Progress Notes (Signed)
Virtual Visit via Video Note  I connected with Melinda Mccormick on 10/23/18 at  1:30 PM EDT by a video enabled telemedicine application and verified that I am speaking with the correct person using two identifiers.  Location: Patient: home Provider:    I discussed the limitations of evaluation and management by telemedicine and the availability of in person appointments. The patient expressed understanding and agreed to proceed.  History of Present Illness:    Observations/Objective:   Assessment and Plan:   Follow Up Instructions:    I discussed the assessment and treatment plan with the patient. The patient was provided an opportunity to ask questions and all were answered. The patient agreed with the plan and demonstrated an understanding of the instructions.   The patient was advised to call back or seek an in-person evaluation if the symptoms worsen or if the condition fails to improve as anticipated.  I provided 15   Established Patient Office Visit  Subjective:  Patient ID: Melinda Mccormick, female    DOB: Apr 12, 1971  Age: 48 y.o. MRN: [id number]  CC:  Chief Complaint  Patient presents with  . Follow-up    lab    HPI Melinda Mccormick presents for follow-up of her persistently elevated liver enzymes.  ALT and GGT remain elevated.  Alkaline phosphatase and AST are in the normal range.  Patient has not been taking Tylenol.  Over the last 2 to 3 weeks she says that she had 1 beer and 1 glass of wine.  0 out of 4 on the CAGE.   Past Medical History:  Diagnosis Date  . Arthritis   . Migraines     Past Surgical History:  Procedure Laterality Date  . BREAST BIOPSY Right   . BREAST BIOPSY Left   . BREAST EXCISIONAL BIOPSY Left     Family History  Problem Relation Age of Onset  . Arthritis Mother   . Diabetes Mother   . Hyperlipidemia Mother   . Hypertension Mother   . Miscarriages / [place] Mother   . Arthritis Father   . Stroke Father   . Asthma Father    . Hyperlipidemia Father   . Miscarriages / Stillbirths Sister   . Asthma Sister   . Cancer Sister   . Arthritis Maternal Grandmother   . Heart attack Maternal Grandmother   . Early death Maternal Grandfather   . Heart attack Maternal Grandfather   . Arthritis Paternal Grandmother   . Heart attack Paternal Grandmother   . Heart attack Paternal Grandfather   . Asthma Sister     Social History   Socioeconomic History  . Marital status: Significant Other    Spouse name: Not on file  . Number of children: Not on file  . Years of education: Not on file  . Highest education level: Not on file  Occupational History  . Not on file  Social Needs  . Financial resource strain: Not on file  . Food insecurity:    Worry: Not on file    Inability: Not on file  . Transportation needs:    Medical: Not on file    Non-medical: Not on file  Tobacco Use  . Smoking status: Never Smoker  . Smokeless tobacco: Never Used  Substance and Sexual Activity  . Alcohol use: Not on file  . Drug use: Not on file  . Sexual activity: Not on file  Lifestyle  . Physical activity:    Days per week: Not on file  Minutes per session: Not on file  . Stress: Not on file  Relationships  . Social connections:    Talks on phone: Not on file    Gets together: Not on file    Attends religious service: Not on file    Active member of club or organization: Not on file    Attends meetings of clubs or organizations: Not on file    Relationship status: Not on file  . Intimate partner violence:    Fear of current or ex partner: Not on file    Emotionally abused: Not on file    Physically abused: Not on file    Forced sexual activity: Not on file  Other Topics Concern  . Not on file  Social History Narrative  . Not on file    Outpatient Medications Prior to Visit  Medication Sig Dispense Refill  . Ascorbic Acid (VITAMIN C) 1000 MG tablet Take 500 mg by mouth daily.    . Diclofenac Sodium (PENNSAID) 2 %  SOLN Place 1 application onto the skin 2 (two) times daily. 1 Bottle 3  . fluticasone (FLONASE) 50 MCG/ACT nasal spray Place 2 sprays into both nostrils daily. 16 g 6  . glucosamine-chondroitin 500-400 MG tablet Take 1 tablet by mouth 2 (two) times daily.    Marland Kitchen omega-3 acid ethyl esters (LOVAZA) 1 g capsule Take by mouth 2 (two) times daily.    . pantoprazole (PROTONIX) 40 MG tablet Take 1 tablet (40 mg total) by mouth daily. 30 tablet 3   No facility-administered medications prior to visit.     No Known Allergies  ROS Review of Systems  Constitutional: Negative.   Respiratory: Negative.   Cardiovascular: Negative.   Gastrointestinal: Negative.   Psychiatric/Behavioral: Negative.       Objective:    Physical Exam  Constitutional: She is oriented to person, place, and time. She appears well-developed and well-nourished. No distress.  HENT:  Head: Normocephalic and atraumatic.  Right Ear: External ear normal.  Left Ear: External ear normal.  Eyes: Right eye exhibits no discharge. Left eye exhibits no discharge. No scleral icterus.  Pulmonary/Chest: Effort normal.  Neurological: She is alert and oriented to person, place, and time.  Skin: She is not diaphoretic.  Psychiatric: She has a normal mood and affect. Her behavior is normal.    Ht 5\' 6"  (1.676 m)   BMI 27.76 kg/m  Wt Readings from Last 3 Encounters:  08/24/18 172 lb (78 kg)  07/27/18 172 lb 12.8 oz (78.4 kg)  07/24/18 172 lb (78 kg)     Health Maintenance Due  Topic Date Due  . HIV Screening  05/28/1986  . TETANUS/TDAP  05/28/1990    There are no preventive care reminders to display for this patient.  Lab Results  Component Value Date   TSH 1.50 07/24/2018   Lab Results  Component Value Date   WBC 7.8 07/24/2018   HGB 12.3 07/24/2018   HCT 38.1 07/24/2018   MCV 84.9 07/24/2018   PLT 404.0 (H) 07/24/2018   Lab Results  Component Value Date   NA 138 07/24/2018   K 4.4 07/24/2018   CO2 24  07/24/2018   GLUCOSE 81 07/24/2018   BUN 12 07/24/2018   CREATININE 0.73 07/24/2018   BILITOT 0.4 10/20/2018   ALKPHOS 157 (H) 08/24/2018   AST 34 10/20/2018   ALT 73 (H) 10/20/2018   PROT 7.5 10/20/2018   ALBUMIN 4.6 08/24/2018   CALCIUM 9.2 07/24/2018   GFR 85.40  07/24/2018   Lab Results  Component Value Date   CHOL 219 (H) 07/24/2018   Lab Results  Component Value Date   HDL 62.80 07/24/2018   Lab Results  Component Value Date   LDLCALC 136 (H) 07/24/2018   Lab Results  Component Value Date   TRIG 98.0 07/24/2018   Lab Results  Component Value Date   CHOLHDL 3 07/24/2018   No results found for: HGBA1C    Assessment & Plan:   Problem List Items Addressed This Visit      Other   Elevated LFTs - Primary   Relevant Orders   Mitochondrial Antibodies   Hepatitis A Ab, Total   Ambulatory referral to Gastroenterology      No orders of the defined types were placed in this encounter.   Follow-up: No follow-ups on file.    Libby Maw, MD minutes of non-face-to-face time during this encounter.

## 2018-10-25 ENCOUNTER — Other Ambulatory Visit: Payer: Self-pay

## 2018-10-25 ENCOUNTER — Encounter: Payer: Self-pay | Admitting: Gastroenterology

## 2018-10-25 ENCOUNTER — Telehealth (INDEPENDENT_AMBULATORY_CARE_PROVIDER_SITE_OTHER): Payer: BLUE CROSS/BLUE SHIELD | Admitting: Gastroenterology

## 2018-10-25 VITALS — Ht 66.0 in | Wt 155.0 lb

## 2018-10-25 DIAGNOSIS — R748 Abnormal levels of other serum enzymes: Secondary | ICD-10-CM

## 2018-10-25 DIAGNOSIS — R740 Nonspecific elevation of levels of transaminase and lactic acid dehydrogenase [LDH]: Secondary | ICD-10-CM | POA: Diagnosis not present

## 2018-10-25 DIAGNOSIS — R7401 Elevation of levels of liver transaminase levels: Secondary | ICD-10-CM

## 2018-10-25 NOTE — Patient Instructions (Addendum)
To help prevent the possible spread of infection to our patients, communities, and staff; we will be implementing the following measures:  As of now we are not allowing any visitors/family members to accompany you to any upcoming appointments with [facility]. If you have any concerns about this please contact our office to discuss prior to the appointment.   Please call to schedule lab appointment for LFT's and follow up appointment in 6 months at [phone]

## 2018-10-25 NOTE — Progress Notes (Signed)
Chief Complaint: Elevated liver enzymes   Referring Provider:    Libby Maw, MD     HPI:    Due to current restrictions/limitations of in-office visits due to the COVID-19 pandemic, this scheduled clinical appointment was converted to a telehealth virtual consultation using [employer].  -Time of medical discussion: 27 minutes -The patient did consent to this virtual visit and is aware of possible charges through their insurance for this visit.  -Names of all parties present: Melinda Mccormick (patient), Gerrit Heck, DO, [facility] (physician) -Patient location: Home -Physician location: Office  Melinda Mccormick is a 48 y.o. female referred to the Gastroenterology Clinic for evaluation of elevated liver enzymes.  Labs on 08/24/2018 AST/ALT/ALP 56/119/157 with GGT 277.  Normal T bili, albumin.  Repeat AST/ALT/ALP downtrending on 5/15 at 34/73/119, with improved GGT at 145.  Liver enzymes mostly normal prior to 08/2018 with ALP generally in the low 100s, mildly elevated ALT in the 60s and normal AST.  HCV negative, HBVs Ab+, HBVs Ag-, TSH normal, cholesterol and LDL slightly elevated (219, 136).  AMA, HAV, HIV ordered; not yet done.  RUQ [place] normal.  Has gained 25-30# over the year despite continued healthy diet. Decreased exercise over the past 2 years or so.   Strong FHx of hypertension, diabetes, hyperlipidemia.  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 Otherwise, no previous known personal or family history of liver or pancreaticobiliary disease.  Denies any history of ascites, jaundice, icteric sclera, and no history of GI bleed, encephalopathy.   She notes that she had a URI in Feb and March, and was taking Dayquil at least BID during that time. Took additional cough medicine (unsure name/ingredients) at that time. Was also taking additional APAP for headaches. Has since stopped all APAP and cold medicine. Now takes caffeine/coffee for headache. No NSAIDs.  Repeat labs demonstrate  downtrending AST/ALT/ALP and GGT as outlined above.  Past medical history, past surgical history, social history, family history, medications, and allergies reviewed in the chart and with patient.    Past Medical History:  Diagnosis Date  . Arthritis   . Migraines      Past Surgical History:  Procedure Laterality Date  . BREAST BIOPSY Right   . BREAST BIOPSY Left   . BREAST EXCISIONAL BIOPSY Left   . SALPINGECTOMY     Family History  Problem Relation Age of Onset  . Arthritis Mother   . Diabetes Mother   . Hyperlipidemia Mother   . Hypertension Mother   . Miscarriages / [place] Mother   . Arthritis Father   . Stroke Father   . Asthma Father   . Hyperlipidemia Father   . Miscarriages / Stillbirths Sister   . Asthma Sister   . Thyroid cancer Sister   . Arthritis Maternal Grandmother   . Heart attack Maternal Grandmother   . Early death Maternal Grandfather   . Heart attack Maternal Grandfather   . Arthritis Paternal Grandmother   . Heart attack Paternal Grandmother   . Heart attack Paternal Grandfather   . Asthma Sister   . Stomach cancer Paternal Uncle   . Leukemia Niece   . Colon cancer Neg Hx   . Esophageal cancer Neg Hx    Social History   Tobacco Use  . Smoking status: Former Research scientist (life sciences)  . Smokeless tobacco: Never Used  . Tobacco comment: smoke 2 years in college. said 2 cigarettes per day. Over 25 years ago  Substance Use Topics  . Alcohol use: Yes    Comment: social  . Drug use: Never   Current Outpatient Medications  Medication Sig Dispense Refill  . Diclofenac Sodium (PENNSAID) 2 % SOLN Place 1 application onto the skin 2 (two) times daily. (Patient taking differently: Place 1 application onto the skin daily. ) 1 Bottle 3  . glucosamine-chondroitin 500-400 MG tablet Take 1 tablet by mouth 2 (two) times daily.    Marland Kitchen omega-3 acid ethyl esters (LOVAZA) 1 g capsule Take by mouth 2 (two) times daily.    . fluticasone (FLONASE) 50 MCG/ACT nasal spray Place  2 sprays into both nostrils daily. (Patient not taking: Reported on 10/25/2018) 16 g 6  . pantoprazole (PROTONIX) 40 MG tablet Take 1 tablet (40 mg total) by mouth daily. (Patient not taking: Reported on 10/25/2018) 30 tablet 3   No current facility-administered medications for this visit.    No Known Allergies   Review of Systems: All systems reviewed and negative except where noted in HPI.     Physical Exam:    Physical exam not completed due to the nature of this telehealth communication.  Patient was otherwise alert and oriented and well communicative.   ASSESSMENT AND PLAN;   1) Elevated ALT 2) Elevated ALP/GGT  48 year old female with elevated ALT since at least 2017 (60s) and recent exacerbation of elevated liver enzymes with peak 56/119/157 with GGT 277 on 3/19.  Enzymes have since down trended.  Liver function otherwise seems preserved with normal albumin and platelets (mildly elevated platelets likely secondary to URI).  Elevated enzymes in the setting of URI and taking cold medicine, DayQuil, additional APAP for headaches.  Ultrasound otherwise normal.  Viral hep studies negative.  Suspect she has some underlying history of fatty liver infiltration as manifested by previous ALT of 60 and otherwise normal enzymes, with exacerbation secondary to combination of infectious illness and/or medications.  - Recommend that she restart a regular exercise program along with continued healthy eating habits with a goal of 10% body weight loss over the next several months -Repeat liver enzymes in 6 months.  If downtrending/normalized, no further work-up.  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

## 2019-01-13 ENCOUNTER — Other Ambulatory Visit: Payer: Self-pay | Admitting: Family Medicine

## 2019-01-13 DIAGNOSIS — K219 Gastro-esophageal reflux disease without esophagitis: Secondary | ICD-10-CM

## 2019-02-25 ENCOUNTER — Other Ambulatory Visit: Payer: Self-pay | Admitting: Family Medicine

## 2019-02-25 DIAGNOSIS — K219 Gastro-esophageal reflux disease without esophagitis: Secondary | ICD-10-CM

## 2019-04-24 ENCOUNTER — Other Ambulatory Visit: Payer: Self-pay | Admitting: Family Medicine

## 2019-04-24 ENCOUNTER — Encounter: Payer: Self-pay | Admitting: Family Medicine

## 2019-04-24 DIAGNOSIS — Z1231 Encounter for screening mammogram for malignant neoplasm of breast: Secondary | ICD-10-CM

## 2019-05-01 ENCOUNTER — Other Ambulatory Visit: Payer: Self-pay

## 2019-05-01 DIAGNOSIS — Z20822 Contact with and (suspected) exposure to covid-19: Secondary | ICD-10-CM

## 2019-05-02 LAB — NOVEL CORONAVIRUS, NAA: SARS-CoV-2, NAA: NOT DETECTED

## 2019-05-06 ENCOUNTER — Other Ambulatory Visit: Payer: Self-pay | Admitting: Family Medicine

## 2019-05-06 DIAGNOSIS — K219 Gastro-esophageal reflux disease without esophagitis: Secondary | ICD-10-CM

## 2019-05-18 ENCOUNTER — Ambulatory Visit: Payer: BLUE CROSS/BLUE SHIELD | Admitting: Nurse Practitioner

## 2019-05-21 DIAGNOSIS — D485 Neoplasm of uncertain behavior of skin: Secondary | ICD-10-CM | POA: Diagnosis not present

## 2019-05-21 DIAGNOSIS — L814 Other melanin hyperpigmentation: Secondary | ICD-10-CM | POA: Diagnosis not present

## 2019-05-21 DIAGNOSIS — D2372 Other benign neoplasm of skin of left lower limb, including hip: Secondary | ICD-10-CM | POA: Diagnosis not present

## 2019-06-06 DIAGNOSIS — D2372 Other benign neoplasm of skin of left lower limb, including hip: Secondary | ICD-10-CM | POA: Diagnosis not present

## 2019-06-11 DIAGNOSIS — Z20828 Contact with and (suspected) exposure to other viral communicable diseases: Secondary | ICD-10-CM | POA: Diagnosis not present

## 2019-06-18 ENCOUNTER — Ambulatory Visit: Payer: BLUE CROSS/BLUE SHIELD

## 2019-07-20 ENCOUNTER — Ambulatory Visit
Admission: RE | Admit: 2019-07-20 | Discharge: 2019-07-20 | Disposition: A | Discharge status: Home / Self Care | Payer: BC Managed Care – PPO | Source: Ambulatory Visit | Attending: Family Medicine | Admitting: Family Medicine

## 2019-07-20 ENCOUNTER — Other Ambulatory Visit: Payer: Self-pay

## 2019-07-20 DIAGNOSIS — Z1231 Encounter for screening mammogram for malignant neoplasm of breast: Secondary | ICD-10-CM | POA: Diagnosis not present

## 2019-08-22 DIAGNOSIS — L814 Other melanin hyperpigmentation: Secondary | ICD-10-CM | POA: Diagnosis not present

## 2019-08-31 ENCOUNTER — Encounter: Payer: Self-pay | Admitting: Family Medicine

## 2019-08-31 ENCOUNTER — Ambulatory Visit (INDEPENDENT_AMBULATORY_CARE_PROVIDER_SITE_OTHER): Payer: BC Managed Care – PPO | Admitting: Family Medicine

## 2019-08-31 ENCOUNTER — Other Ambulatory Visit: Payer: Self-pay

## 2019-08-31 VITALS — BP 118/74 | HR 81 | Temp 97.7°F | Ht 65.0 in | Wt 169.2 lb

## 2019-08-31 DIAGNOSIS — Z0000 Encounter for general adult medical examination without abnormal findings: Secondary | ICD-10-CM | POA: Diagnosis not present

## 2019-08-31 DIAGNOSIS — G514 Facial myokymia: Secondary | ICD-10-CM | POA: Insufficient documentation

## 2019-08-31 DIAGNOSIS — R7989 Other specified abnormal findings of blood chemistry: Secondary | ICD-10-CM

## 2019-08-31 LAB — URINALYSIS, ROUTINE W REFLEX MICROSCOPIC
Bilirubin Urine: NEGATIVE
Hgb urine dipstick: NEGATIVE
Ketones, ur: NEGATIVE
Leukocytes,Ua: NEGATIVE
Nitrite: NEGATIVE
Specific Gravity, Urine: 1.01 (ref 1.000–1.030)
Total Protein, Urine: NEGATIVE
Urine Glucose: NEGATIVE
Urobilinogen, UA: 0.2 (ref 0.0–1.0)
pH: 7 (ref 5.0–8.0)

## 2019-08-31 LAB — HEPATIC FUNCTION PANEL
ALT: 61 U/L — ABNORMAL HIGH (ref 0–35)
AST: 32 U/L (ref 0–37)
Albumin: 4.3 g/dL (ref 3.5–5.2)
Alkaline Phosphatase: 117 U/L (ref 39–117)
Bilirubin, Direct: 0.1 mg/dL (ref 0.0–0.3)
Total Bilirubin: 0.5 mg/dL (ref 0.2–1.2)
Total Protein: 6.9 g/dL (ref 6.0–8.3)

## 2019-08-31 LAB — CBC
HCT: 36.4 % (ref 36.0–46.0)
Hemoglobin: 12.2 g/dL (ref 12.0–15.0)
MCHC: 33.6 g/dL (ref 30.0–36.0)
MCV: 81.2 fl (ref 78.0–100.0)
Platelets: 371 10*3/uL (ref 150.0–400.0)
RBC: 4.48 Mil/uL (ref 3.87–5.11)
RDW: 16 % — ABNORMAL HIGH (ref 11.5–15.5)
WBC: 7 10*3/uL (ref 4.0–10.5)

## 2019-08-31 LAB — BASIC METABOLIC PANEL
BUN: 10 mg/dL (ref 6–23)
CO2: 28 mEq/L (ref 19–32)
Calcium: 9.5 mg/dL (ref 8.4–10.5)
Chloride: 105 mEq/L (ref 96–112)
Creatinine, Ser: 0.71 mg/dL (ref 0.40–1.20)
GFR: 87.77 mL/min (ref >60.00)
Glucose, Bld: 93 mg/dL (ref 70–99)
Potassium: 4.2 mEq/L (ref 3.5–5.1)
Sodium: 136 mEq/L (ref 135–145)

## 2019-08-31 LAB — LIPID PANEL
Cholesterol: 243 mg/dL — ABNORMAL HIGH (ref 0–200)
HDL: 59.4 mg/dL (ref >39.00)
LDL Cholesterol: 157 mg/dL — ABNORMAL HIGH (ref 0–99)
NonHDL: 183.58
Total CHOL/HDL Ratio: 4
Triglycerides: 135 mg/dL (ref 0.0–149.0)
VLDL: 27 mg/dL (ref 0.0–40.0)

## 2019-08-31 LAB — TSH: TSH: 2.71 u[IU]/mL (ref 0.35–4.50)

## 2019-08-31 LAB — T4, FREE: Free T4: 0.75 ng/dL (ref 0.60–1.60)

## 2019-08-31 LAB — MAGNESIUM: Magnesium: 2 mg/dL (ref 1.5–2.5)

## 2019-08-31 NOTE — Progress Notes (Signed)
Established Patient Office Visit  Subjective:  Patient ID: Melinda Mccormick, female    DOB: 09-26-1970  Age: 49 y.o. MRN: [id number]  CC:  Chief Complaint  Patient presents with  . Annual Exam    CPE, c/o right eye very twitchy x 1 week. Would like THS checked    HPI Melinda Mccormick presents for follow-up of her medical issues as well as physical exam.  Status post consultation with gastroenterology for elevated liver enzymes.  Blood work is still pending on chart for her per the consultant and that will be followed today.  Continues to drink sparingly.  Her sister was recently diagnosed with thyroid cancer and she would like to have thyroid labs drawn today.  She has noticed some twitching in her right upper eyelid.  She has been taking vitamin a for relief.  She continues to exercise by walking.  Recent GYN check.  She is seeing the dentist regularly for cleanings.  Left knee continues to bother her son but it seems to be improving.  Pain continues to be located around her kneecap.  It is worse when she is walking up and down stairs.  Past Medical History:  Diagnosis Date  . Arthritis   . Migraines     Past Surgical History:  Procedure Laterality Date  . BREAST BIOPSY Right   . BREAST BIOPSY Left   . BREAST EXCISIONAL BIOPSY Left   . SALPINGECTOMY      Family History  Problem Relation Age of Onset  . Arthritis Mother   . Diabetes Mother   . Hyperlipidemia Mother   . Hypertension Mother   . Miscarriages / [place] Mother   . Arthritis Father   . Stroke Father   . Asthma Father   . Hyperlipidemia Father   . Miscarriages / Stillbirths Sister   . Asthma Sister   . Thyroid cancer Sister   . Arthritis Maternal Grandmother   . Heart attack Maternal Grandmother   . Early death Maternal Grandfather   . Heart attack Maternal Grandfather   . Arthritis Paternal Grandmother   . Heart attack Paternal Grandmother   . Heart attack Paternal Grandfather   . Asthma Sister   .  Stomach cancer Paternal Uncle   . Leukemia Niece   . Colon cancer Neg Hx   . Esophageal cancer Neg Hx     Social History   Socioeconomic History  . Marital status: Significant Other    Spouse name: Not on file  . Number of children: Not on file  . Years of education: Not on file  . Highest education level: Not on file  Occupational History  . Not on file  Tobacco Use  . Smoking status: Former Research scientist (life sciences)  . Smokeless tobacco: Never Used  . Tobacco comment: smoke 2 years in college. said 2 cigarettes per day. Over 25 years ago  Substance and Sexual Activity  . Alcohol use: Yes    Comment: social  . Drug use: Never  . Sexual activity: Not on file  Other Topics Concern  . Not on file  Social History Narrative  . Not on file   Social Determinants of Health   Financial Resource Strain:   . Difficulty of Paying Living Expenses:   Food Insecurity:   . Worried About Charity fundraiser in the Last Year:   . Arboriculturist in the Last Year:   Transportation Needs:   . Film/video editor (Medical):   Marland Kitchen Lack of Transportation (  Non-Medical):   Physical Activity:   . Days of Exercise per Week:   . Minutes of Exercise per Session:   Stress:   . Feeling of Stress :   Social Connections:   . Frequency of Communication with Friends and Family:   . Frequency of Social Gatherings with Friends and Family:   . Attends Religious Services:   . Active Member of Clubs or Organizations:   . Attends Archivist Meetings:   Marland Kitchen Marital Status:   Intimate Partner Violence:   . Fear of Current or Ex-Partner:   . Emotionally Abused:   Marland Kitchen Physically Abused:   . Sexually Abused:     Outpatient Medications Prior to Visit  Medication Sig Dispense Refill  . Ascorbic Acid (VITAMIN C) 1000 MG tablet Take 1,000 mg by mouth daily.    Marland Kitchen glucosamine-chondroitin 500-400 MG tablet Take 1 tablet by mouth 2 (two) times daily.    Marland Kitchen omega-3 acid ethyl esters (LOVAZA) 1 g capsule Take by mouth 2  (two) times daily.    . pantoprazole (PROTONIX) 40 MG tablet Take 1 tablet by mouth once daily 90 tablet 1  . Diclofenac Sodium (PENNSAID) 2 % SOLN Place 1 application onto the skin 2 (two) times daily. (Patient not taking: Reported on 08/31/2019) 1 Bottle 3  . fluticasone (FLONASE) 50 MCG/ACT nasal spray Place 2 sprays into both nostrils daily. (Patient not taking: Reported on 10/25/2018) 16 g 6   No facility-administered medications prior to visit.    No Known Allergies  ROS Review of Systems  Constitutional: Negative.   HENT: Negative.   Eyes: Negative for photophobia and visual disturbance.  Respiratory: Negative.   Cardiovascular: Negative.   Gastrointestinal: Negative.   Endocrine: Negative for polyphagia and polyuria.  Genitourinary: Negative.   Musculoskeletal: Positive for arthralgias. Negative for gait problem and joint swelling.  Skin: Negative for pallor and rash.  Allergic/Immunologic: Negative for immunocompromised state.  Neurological: Negative for tremors and speech difficulty.  Hematological: Does not bruise/bleed easily.  Psychiatric/Behavioral: Negative.    Depression screen [facility] 2/9 08/31/2019 08/31/2019  Decreased Interest 0 0  Down, Depressed, Hopeless 0 0  PHQ - 2 Score 0 0  Altered sleeping 0 -  Tired, decreased energy 0 -  Change in appetite 0 -  Feeling bad or failure about yourself  0 -  Trouble concentrating 0 -  Moving slowly or fidgety/restless 0 -  Suicidal thoughts 0 -  PHQ-9 Score 0 -      Objective:    Physical Exam  Constitutional: She is oriented to person, place, and time. She appears well-developed and well-nourished. No distress.  HENT:  Head: Normocephalic and atraumatic.  Right Ear: External ear normal.  Left Ear: External ear normal.  Eyes: Pupils are equal, round, and reactive to light. Conjunctivae are normal. Right eye exhibits no discharge. Left eye exhibits no discharge. No scleral icterus.  Neck: No JVD present. No tracheal  deviation present. No thyromegaly present.  Cardiovascular: Normal rate, regular rhythm and normal heart sounds.  Pulmonary/Chest: Effort normal and breath sounds normal. No stridor.  Abdominal: Bowel sounds are normal.  Musculoskeletal:        General: No edema.     Cervical back: Neck supple.     Left knee: No swelling. Normal range of motion.  Lymphadenopathy:    She has no cervical adenopathy.  Neurological: She is alert and oriented to person, place, and time.  Skin: Skin is warm and dry. She is not  diaphoretic.  Psychiatric: She has a normal mood and affect. Her behavior is normal.    BP 118/74   Pulse 81   Temp 97.7 F (36.5 C) (Tympanic)   Ht 5\' 5"  (1.651 m)   Wt 169 lb 3.2 oz (76.7 kg)   SpO2 96%   BMI 28.16 kg/m  Wt Readings from Last 3 Encounters:  08/31/19 169 lb 3.2 oz (76.7 kg)  10/25/18 155 lb (70.3 kg)  08/24/18 172 lb (78 kg)     Health Maintenance Due  Topic Date Due  . HIV Screening  Never done  . TETANUS/TDAP  Never done    There are no preventive care reminders to display for this patient.  Lab Results  Component Value Date   TSH 1.50 07/24/2018   Lab Results  Component Value Date   WBC 7.8 07/24/2018   HGB 12.3 07/24/2018   HCT 38.1 07/24/2018   MCV 84.9 07/24/2018   PLT 404.0 (H) 07/24/2018   Lab Results  Component Value Date   NA 138 07/24/2018   K 4.4 07/24/2018   CO2 24 07/24/2018   GLUCOSE 81 07/24/2018   BUN 12 07/24/2018   CREATININE 0.73 07/24/2018   BILITOT 0.4 10/20/2018   ALKPHOS 157 (H) 08/24/2018   AST 34 10/20/2018   ALT 73 (H) 10/20/2018   PROT 7.5 10/20/2018   ALBUMIN 4.6 08/24/2018   CALCIUM 9.2 07/24/2018   GFR 85.40 07/24/2018   Lab Results  Component Value Date   CHOL 219 (H) 07/24/2018   Lab Results  Component Value Date   HDL 62.80 07/24/2018   Lab Results  Component Value Date   LDLCALC 136 (H) 07/24/2018   Lab Results  Component Value Date   TRIG 98.0 07/24/2018   Lab Results  Component  Value Date   CHOLHDL 3 07/24/2018   No results found for: HGBA1C    Assessment & Plan:   Problem List Items Addressed This Visit      Other   Healthcare maintenance   Relevant Orders   Basic metabolic panel   CBC   Lipid panel   Urinalysis, Routine w reflex microscopic   Elevated LFTs - Primary   Relevant Orders   Hepatitis A Ab, Total   Mitochondrial Antibodies   Hepatic function panel   Eyelid myokymia   Relevant Orders   TSH   Magnesium   T4, free      No orders of the defined types were placed in this encounter.   Follow-up: Return in about 1 year (around 08/30/2020), or if symptoms worsen or fail to improve.   Advised her to be careful with vitamin A replacement because there is some risk for liver toxicity.  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  Advised knee extension exercises.  Will refer her back for her sports medicine consultation at her request. Libby Maw, MD

## 2019-08-31 NOTE — Patient Instructions (Signed)
Health Maintenance, Female Adopting a healthy lifestyle and getting preventive care are important in promoting health and wellness. Ask your health care provider about:  The right schedule for you to have regular tests and exams.  Things you can do on your own to prevent diseases and keep yourself healthy. What should I know about diet, weight, and exercise? Eat a healthy diet   Eat a diet that includes plenty of vegetables, fruits, low-fat dairy products, and lean protein.  Do not eat a lot of foods that are high in solid fats, added sugars, or sodium. Maintain a healthy weight Body mass index (BMI) is used to identify weight problems. It estimates body fat based on height and weight. Your health care provider can help determine your BMI and help you achieve or maintain a healthy weight. Get regular exercise Get regular exercise. This is one of the most important things you can do for your health. Most adults should:  Exercise for at least 150 minutes each week. The exercise should increase your heart rate and make you sweat (moderate-intensity exercise).  Do strengthening exercises at least twice a week. This is in addition to the moderate-intensity exercise.  Spend less time sitting. Even light physical activity can be beneficial. Watch cholesterol and blood lipids Have your blood tested for lipids and cholesterol at 49 years of age, then have this test every 5 years. Have your cholesterol levels checked more often if:  Your lipid or cholesterol levels are high.  You are older than 49 years of age.  You are at high risk for heart disease. What should I know about cancer screening? Depending on your health history and family history, you may need to have cancer screening at various ages. This may include screening for:  Breast cancer.  Cervical cancer.  Colorectal cancer.  Skin cancer.  Lung cancer. What should I know about heart disease, diabetes, and high blood  pressure? Blood pressure and heart disease  High blood pressure causes heart disease and increases the risk of stroke. This is more likely to develop in people who have high blood pressure readings, are of African descent, or are overweight.  Have your blood pressure checked: ? Every 3-5 years if you are 49-49 years of age. ? Every year if you are 49 years old or older. Diabetes Have regular diabetes screenings. This checks your fasting blood sugar level. Have the screening done:  Once every three years after age 49 if you are at a normal weight and have a low risk for diabetes.  More often and at a younger age if you are overweight or have a high risk for diabetes. What should I know about preventing infection? Hepatitis B If you have a higher risk for hepatitis B, you should be screened for this virus. Talk with your health care provider to find out if you are at risk for hepatitis B infection. Hepatitis C Testing is recommended for:  Everyone born from [birth year] through 1941.  Anyone with known risk factors for hepatitis C. Sexually transmitted infections (STIs)  Get screened for STIs, including gonorrhea and chlamydia, if: ? You are sexually active and are younger than 49 years of age. ? You are older than 49 years of age and your health care provider tells you that you are at risk for this type of infection. ? Your sexual activity has changed since you were last screened, and you are at increased risk for chlamydia or gonorrhea. Ask your health care provider if  you are at risk. °· Ask your health care provider about whether you are at high risk for HIV. Your health care provider may recommend a prescription medicine to help prevent HIV infection. If you choose to take medicine to prevent HIV, you should first get tested for HIV. You should then be tested every 3 months for as long as you are taking the medicine. °Pregnancy °· If you are about to stop having your period (premenopausal) and  you may become pregnant, seek counseling before you get pregnant. °· Take 400 to 800 micrograms (mcg) of folic acid every day if you become pregnant. °· Ask for birth control (contraception) if you want to prevent pregnancy. °Osteoporosis and menopause °Osteoporosis is a disease in which the bones lose minerals and strength with aging. This can result in bone fractures. If you are 49 years old or older, or if you are at risk for osteoporosis and fractures, ask your health care provider if you should: °· Be screened for bone loss. °· Take a calcium or vitamin D supplement to lower your risk of fractures. °· Be given hormone replacement therapy (HRT) to treat symptoms of menopause. °Follow these instructions at home: °Lifestyle °· Do not use any products that contain nicotine or tobacco, such as cigarettes, e-cigarettes, and chewing tobacco. If you need help quitting, ask your health care provider. °· Do not use street drugs. °· Do not share needles. °· Ask your health care provider for help if you need support or information about quitting drugs. °Alcohol use °· Do not drink alcohol if: °? Your health care provider tells you not to drink. °? You are pregnant, may be pregnant, or are planning to become pregnant. °· If you drink alcohol: °? Limit how much you use to 0-1 drink a day. °? Limit intake if you are breastfeeding. °· Be aware of how much alcohol is in your drink. In the [place] one drink equals one 12 oz bottle of beer (355 mL), one 5 oz glass of wine (148 mL), or one 1½ oz glass of hard liquor (44 mL). °General instructions °· Schedule regular health, dental, and eye exams. °· Stay current with your vaccines. °· Tell your health care provider if: °? You often feel depressed. °? You have ever been abused or do not feel safe at home. °Summary °· Adopting a healthy lifestyle and getting preventive care are important in promoting health and wellness. °· Follow your health care provider's instructions about healthy  diet, exercising, and getting tested or screened for diseases. °· Follow your health care provider's instructions on monitoring your cholesterol and blood pressure. °This information is not intended to replace advice given to you by your health care provider. Make sure you discuss any questions you have with your health care provider. °Document Revised: 05/17/2018 Document Reviewed: 05/17/2018 °Elsevier Patient Education © 2020 [employer] ° °Preventive Care 40-64 Years Old, Female °Preventive care refers to visits with your health care provider and lifestyle choices that can promote health and wellness. This includes: °· A yearly physical exam. This may also be called an annual well check. °· Regular dental visits and eye exams. °· Immunizations. °· Screening for certain conditions. °· Healthy lifestyle choices, such as eating a healthy diet, getting regular exercise, not using drugs or products that contain nicotine and tobacco, and limiting alcohol use. °What can I expect for my preventive care visit? °Physical exam °Your health care provider will check your: °· Height and weight. This may be used   to calculate body mass index (BMI), which tells if you are at a healthy weight. °· Heart rate and blood pressure. °· Skin for abnormal spots. °Counseling °Your health care provider may ask you questions about your: °· Alcohol, tobacco, and drug use. °· Emotional well-being. °· Home and relationship well-being. °· Sexual activity. °· Eating habits. °· Work and work environment. °· Method of birth control. °· Menstrual cycle. °· Pregnancy history. °What immunizations do I need? ° °Influenza (flu) vaccine °· This is recommended every year. °Tetanus, diphtheria, and pertussis (Tdap) vaccine °· You may need a Td booster every 10 years. °Varicella (chickenpox) vaccine °· You may need this if you have not been vaccinated. °Zoster (shingles) vaccine °· You may need this after age 60. °Measles, mumps, and rubella (MMR)  vaccine °· You may need at least one dose of MMR if you were born in 1957 or later. You may also need a second dose. °Pneumococcal conjugate (PCV13) vaccine °· You may need this if you have certain conditions and were not previously vaccinated. °Pneumococcal polysaccharide (PPSV23) vaccine °· You may need one or two doses if you smoke cigarettes or if you have certain conditions. °Meningococcal conjugate (MenACWY) vaccine °· You may need this if you have certain conditions. °Hepatitis A vaccine °· You may need this if you have certain conditions or if you travel or work in places where you may be exposed to hepatitis A. °Hepatitis B vaccine °· You may need this if you have certain conditions or if you travel or work in places where you may be exposed to hepatitis B. °Haemophilus influenzae type b (Hib) vaccine °· You may need this if you have certain conditions. °Human papillomavirus (HPV) vaccine °· If recommended by your health care provider, you may need three doses over 6 months. °You may receive vaccines as individual doses or as more than one vaccine together in one shot (combination vaccines). Talk with your health care provider about the risks and benefits of combination vaccines. °What tests do I need? °Blood tests °· Lipid and cholesterol levels. These may be checked every 5 years, or more frequently if you are over 50 years old. °· Hepatitis C test. °· Hepatitis B test. °Screening °· Lung cancer screening. You may have this screening every year starting at age 55 if you have a 30-pack-year history of smoking and currently smoke or have quit within the past 15 years. °· Colorectal cancer screening. All adults should have this screening starting at age 50 and continuing until age 75. Your health care provider may recommend screening at age 45 if you are at increased risk. You will have tests every 1-10 years, depending on your results and the type of screening test. °· Diabetes screening. This is done by  checking your blood sugar (glucose) after you have not eaten for a while (fasting). You may have this done every 1-3 years. °· Mammogram. This may be done every 1-2 years. Talk with your health care provider about when you should start having regular mammograms. This may depend on whether you have a family history of breast cancer. °· BRCA-related cancer screening. This may be done if you have a family history of breast, ovarian, tubal, or peritoneal cancers. °· Pelvic exam and Pap test. This may be done every 3 years starting at age 21. Starting at age 30, this may be done every 5 years if you have a Pap test in combination with an HPV test. °Other tests °· Sexually transmitted disease (  STD) testing.  Bone density scan. This is done to screen for osteoporosis. You may have this scan if you are at high risk for osteoporosis. Follow these instructions at home: Eating and drinking  Eat a diet that includes fresh fruits and vegetables, whole grains, lean protein, and low-fat dairy.  Take vitamin and mineral supplements as recommended by your health care provider.  Do not drink alcohol if: ? Your health care provider tells you not to drink. ? You are pregnant, may be pregnant, or are planning to become pregnant.  If you drink alcohol: ? Limit how much you have to 0-1 drink a day. ? Be aware of how much alcohol is in your drink. In the [place] one drink equals one 12 oz bottle of beer (355 mL), one 5 oz glass of wine (148 mL), or one 1 oz glass of hard liquor (44 mL). Lifestyle  Take daily care of your teeth and gums.  Stay active. Exercise for at least 30 minutes on 5 or more days each week.  Do not use any products that contain nicotine or tobacco, such as cigarettes, e-cigarettes, and chewing tobacco. If you need help quitting, ask your health care provider.  If you are sexually active, practice safe sex. Use a condom or other form of birth control (contraception) in order to prevent pregnancy  and STIs (sexually transmitted infections).  If told by your health care provider, take low-dose aspirin daily starting at age 30. What's next?  Visit your health care provider once a year for a well check visit.  Ask your health care provider how often you should have your eyes and teeth checked.  Stay up to date on all vaccines. This information is not intended to replace advice given to you by your health care provider. Make sure you discuss any questions you have with your health care provider. Document Revised: 02/02/2018 Document Reviewed: 02/02/2018 Elsevier Patient Education  [place] Stress Reduction Mindfulness-based stress reduction (MBSR) is a program that helps people learn to practice mindfulness. Mindfulness is the practice of intentionally paying attention to the present moment. It can be learned and practiced through techniques such as education, breathing exercises, meditation, and yoga. MBSR includes several mindfulness techniques in one program. MBSR works best when you understand the treatment, are willing to try new things, and can commit to spending time practicing what you learn. MBSR training may include learning about:  How your emotions, thoughts, and reactions affect your body.  New ways to respond to things that cause negative thoughts to start (triggers).  How to notice your thoughts and let go of them.  Practicing awareness of everyday things that you normally do without thinking.  The techniques and goals of different types of meditation. What are the benefits of MBSR? MBSR can have many benefits, which include helping you to:  Develop self-awareness. This refers to knowing and understanding yourself.  Learn skills and attitudes that help you to participate in your own health care.  Learn new ways to care for yourself.  Be more accepting about how things are, and let things go.  Be less judgmental and approach things  with an open mind.  Be patient with yourself and trust yourself more. MBSR has also been shown to:  Reduce negative emotions, such as depression and anxiety.  Improve memory and focus.  Change how you sense and approach pain.  Boost your body's ability to fight infections.  Help you connect better with other  people.  Improve your sense of well-being. Follow these instructions at home:   Find a local in-person or online MBSR program.  Set aside some time regularly for mindfulness practice.  Find a mindfulness practice that works best for you. This may include one or more of the following: ? Meditation. Meditation involves focusing your mind on a certain thought or activity. ? Breathing awareness exercises. These help you to stay present by focusing on your breath. ? Body scan. For this practice, you lie down and pay attention to each part of your body from head to toe. You can identify tension and soreness and intentionally relax parts of your body. ? Yoga. Yoga involves stretching and breathing, and it can improve your ability to move and be flexible. It can also provide an experience of testing your body's limits, which can help you release stress. ? Mindful eating. This way of eating involves focusing on the taste, texture, color, and smell of each bite of food. Because this slows down eating and helps you feel full sooner, it can be an important part of a weight-loss plan.  Find a podcast or recording that provides guidance for breathing awareness, body scan, or meditation exercises. You can listen to these any time when you have a free moment to rest without distractions.  Follow your treatment plan as told by your health care provider. This may include taking regular medicines and making changes to your diet or lifestyle as recommended. How to practice mindfulness To do a basic awareness exercise:  Find a comfortable place to sit.  Pay attention to the present moment.  Observe your thoughts, feelings, and surroundings just as they are.  Avoid placing judgment on yourself, your feelings, or your surroundings. Make note of any judgment that comes up, and let it go.  Your mind may wander, and that is okay. Make note of when your thoughts drift, and return your attention to the present moment. To do basic mindfulness meditation:  Find a comfortable place to sit. This may include a stable chair or a firm floor cushion. ? Sit upright with your back straight. Let your arms fall next to your side with your hands resting on your legs. ? If sitting in a chair, rest your feet flat on the floor. ? If sitting on a cushion, cross your legs in front of you.  Keep your head in a neutral position with your chin dropped slightly. Relax your jaw and rest the tip of your tongue on the roof of your mouth. Drop your gaze to the floor. You can close your eyes if you like.  Breathe normally and pay attention to your breath. Feel the air moving in and out of your nose. Feel your belly expanding and relaxing with each breath.  Your mind may wander, and that is okay. Make note of when your thoughts drift, and return your attention to your breath.  Avoid placing judgment on yourself, your feelings, or your surroundings. Make note of any judgment or feelings that come up, let them go, and bring your attention back to your breath.  When you are ready, lift your gaze or open your eyes. Pay attention to how your body feels after the meditation. Where to find more information You can find more information about MBSR from:  Your health care provider.  Community-based meditation centers or programs.  Programs offered near you. Summary  Mindfulness-based stress reduction (MBSR) is a program that teaches you how to intentionally pay attention to  the present moment. It is used with other treatments to help you cope better with daily stress, emotions, and pain.  MBSR focuses on  developing self-awareness, which allows you to respond to life stress without judgment or negative emotions.  MBSR programs may involve learning different mindfulness practices, such as breathing exercises, meditation, yoga, body scan, or mindful eating. Find a mindfulness practice that works best for you, and set aside time for it on a regular basis. This information is not intended to replace advice given to you by your health care provider. Make sure you discuss any questions you have with your health care provider. Document Revised: 05/06/2017 Document Reviewed: 09/30/2016 Elsevier Patient Education  [place].

## 2019-09-04 LAB — MITOCHONDRIAL ANTIBODIES: Mitochondrial M2 Ab, IgG: <=20 U

## 2019-09-04 LAB — HEPATITIS A ANTIBODY, TOTAL: Hepatitis A AB,Total: REACTIVE — AB

## 2019-09-05 ENCOUNTER — Telehealth: Payer: Self-pay | Admitting: Family Medicine

## 2019-09-05 ENCOUNTER — Telehealth: Payer: Self-pay | Admitting: Gastroenterology

## 2019-09-05 NOTE — Telephone Encounter (Signed)
Lmom for patient to call back 

## 2019-09-05 NOTE — Telephone Encounter (Signed)
Patient is calling and wanted to see if Dr. Ethelene Hal received forms that was faxed. CB is [phone]

## 2019-09-06 NOTE — Telephone Encounter (Signed)
Patient aware no forms have come through as of now. Pt states that she will re fax them tomorrow she is aware that we are closed and I will let her know if they came through on Monday.

## 2019-09-10 ENCOUNTER — Telehealth: Payer: Self-pay | Admitting: Family Medicine

## 2019-09-10 NOTE — Telephone Encounter (Signed)
Tried to call about message we received from after hours nurse, They said pt called to get fax number for office and they gave her [phone], I called to let her know that the fax number is actually [phone], Lvm

## 2019-09-10 NOTE — Telephone Encounter (Signed)
Patient is calling and wanted to see if Dr. Ethelene Hal received a form that was faxed. CB is [phone]

## 2019-09-17 ENCOUNTER — Encounter: Payer: Self-pay | Admitting: Family Medicine

## 2019-09-18 NOTE — Telephone Encounter (Signed)
Please see message and advise.  Thank you. ° °

## 2019-09-19 NOTE — Telephone Encounter (Signed)
Spoke with patient who verbally understood we have not received forms that she faxed over. Patient states that she will try to fax again and if not received she will send forms with her husband when he comes in for his visit this week.

## 2019-09-19 NOTE — Telephone Encounter (Signed)
Duplicate message. 

## 2019-09-22 DIAGNOSIS — D259 Leiomyoma of uterus, unspecified: Secondary | ICD-10-CM | POA: Diagnosis not present

## 2019-09-22 DIAGNOSIS — R1030 Lower abdominal pain, unspecified: Secondary | ICD-10-CM | POA: Diagnosis not present

## 2019-09-22 DIAGNOSIS — N83201 Unspecified ovarian cyst, right side: Secondary | ICD-10-CM | POA: Diagnosis not present

## 2019-09-22 DIAGNOSIS — Z3202 Encounter for pregnancy test, result negative: Secondary | ICD-10-CM | POA: Diagnosis not present

## 2019-09-22 DIAGNOSIS — R109 Unspecified abdominal pain: Secondary | ICD-10-CM | POA: Diagnosis not present

## 2019-09-24 DIAGNOSIS — N920 Excessive and frequent menstruation with regular cycle: Secondary | ICD-10-CM | POA: Diagnosis not present

## 2019-09-24 DIAGNOSIS — R102 Pelvic and perineal pain: Secondary | ICD-10-CM | POA: Diagnosis not present

## 2019-09-25 ENCOUNTER — Telehealth: Payer: Self-pay | Admitting: Family Medicine

## 2019-09-25 DIAGNOSIS — N83209 Unspecified ovarian cyst, unspecified side: Secondary | ICD-10-CM

## 2019-09-25 NOTE — Telephone Encounter (Signed)
Patient is calling and requesting a call back. CB is [phone]

## 2019-09-27 NOTE — Telephone Encounter (Signed)
Spoke with patient who states that she was seen at ED for ruptured ovary over the weekend. Per patient she still has some pain and cramping just recently came on period but would like to be referred to [facility] for follow up and possible ultrasound. Okay to refer patient? Please advise.

## 2019-09-27 NOTE — Telephone Encounter (Signed)
Please do

## 2019-09-27 NOTE — Telephone Encounter (Signed)
Referral placed, patient aware and awaiting call to schedule appointment.

## 2019-10-01 ENCOUNTER — Encounter: Payer: Self-pay | Admitting: Family Medicine

## 2019-10-02 NOTE — Telephone Encounter (Signed)
Patient is calling and wanted to speak to someone regarding a referral. CB is [phone]

## 2019-10-04 DIAGNOSIS — Z1151 Encounter for screening for human papillomavirus (HPV): Secondary | ICD-10-CM | POA: Diagnosis not present

## 2019-10-04 DIAGNOSIS — N8312 Corpus luteum cyst of left ovary: Secondary | ICD-10-CM | POA: Diagnosis not present

## 2019-10-04 DIAGNOSIS — R102 Pelvic and perineal pain: Secondary | ICD-10-CM | POA: Diagnosis not present

## 2019-10-04 DIAGNOSIS — N926 Irregular menstruation, unspecified: Secondary | ICD-10-CM | POA: Diagnosis not present

## 2019-10-04 DIAGNOSIS — D251 Intramural leiomyoma of uterus: Secondary | ICD-10-CM | POA: Diagnosis not present

## 2019-10-06 HISTORY — PX: OVARIAN CYST SURGERY: SHX726

## 2019-10-08 ENCOUNTER — Ambulatory Visit: Payer: BC Managed Care – PPO | Admitting: Gastroenterology

## 2020-01-06 ENCOUNTER — Encounter: Payer: Self-pay | Admitting: Family Medicine

## 2020-01-07 NOTE — Telephone Encounter (Signed)
Thyroid hormone levels were normal. It is only when someone has hypothyroidism that treating the disease can help the cholesterol. Please lower fat and cholesterol in diet. We will recheck as directed. If you would like to return sooner that would be great.

## 2020-01-19 ENCOUNTER — Encounter: Payer: Self-pay | Admitting: Family Medicine

## 2020-03-23 IMAGING — MG DIGITAL SCREENING BILAT W/ CAD
4 series · 4 of 4 positions shown · non-contrast
Comparison: Previous exam(s).

CLINICAL DATA: Screening.

EXAM:
DIGITAL SCREENING BILATERAL MAMMOGRAM WITH CAD

[L MLO]
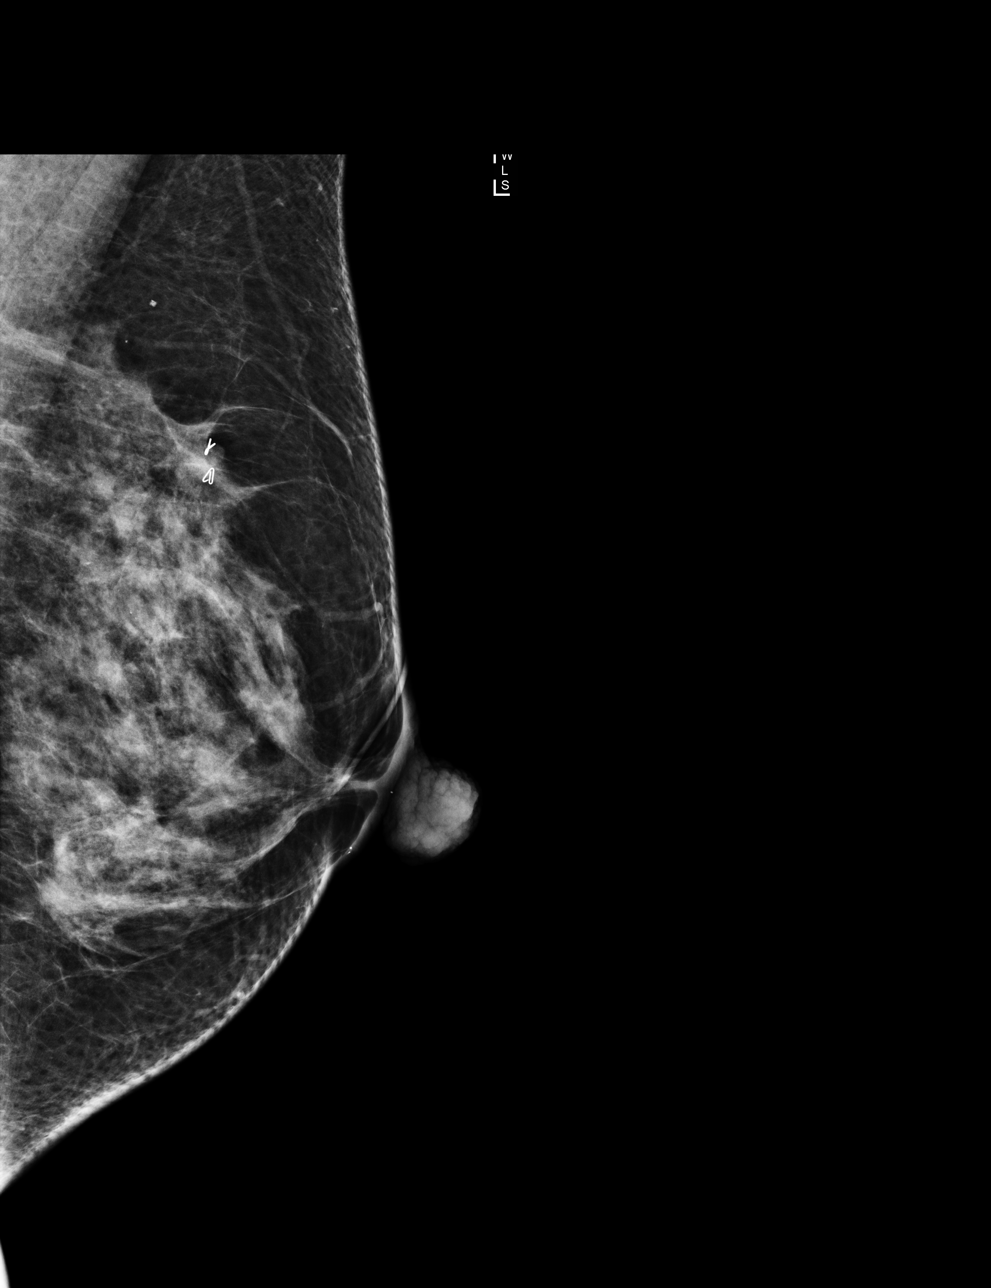

[R CC]
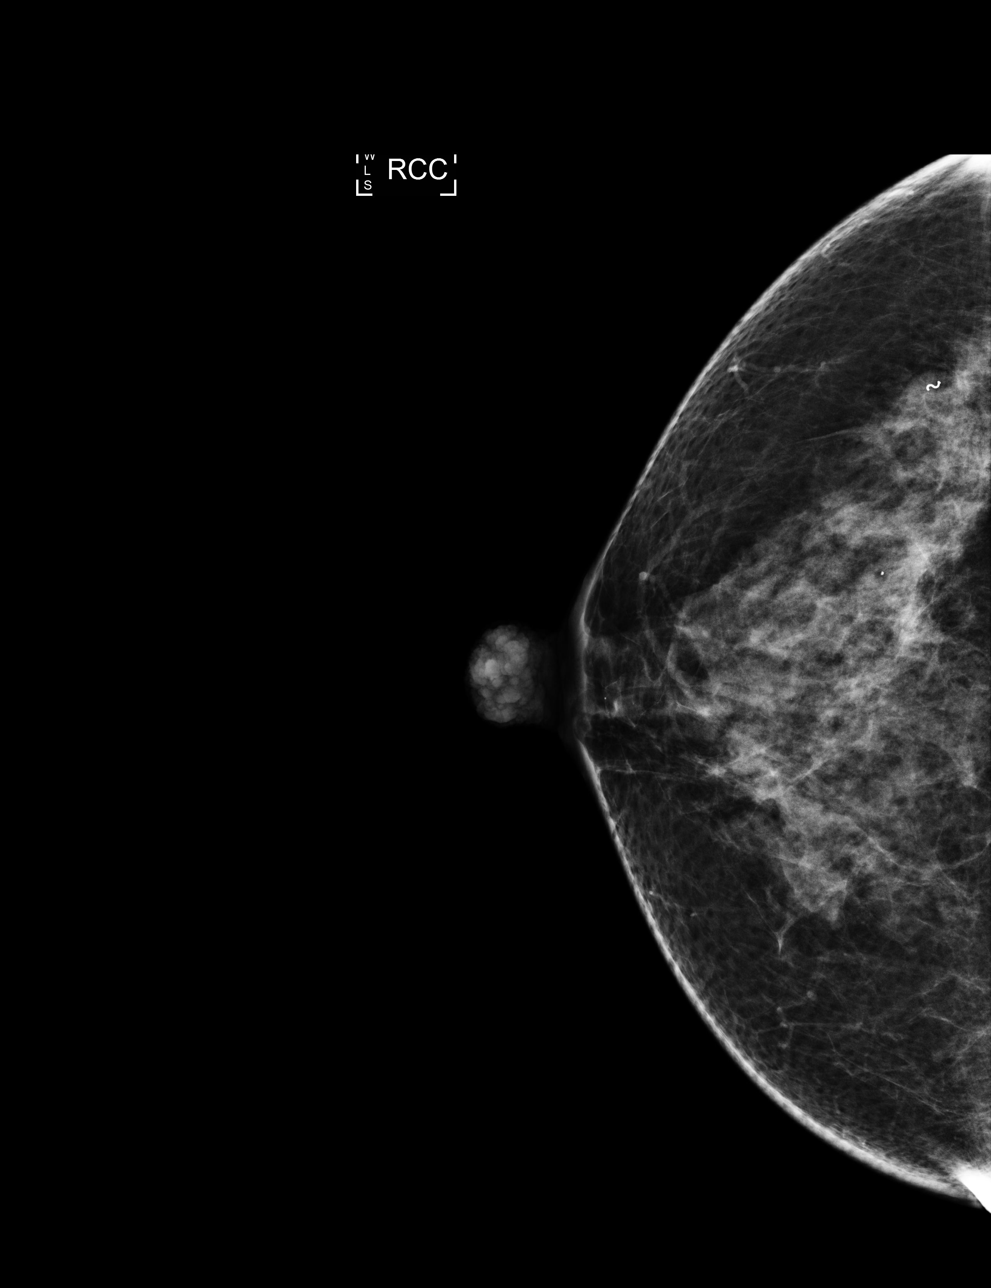

[R MLO]
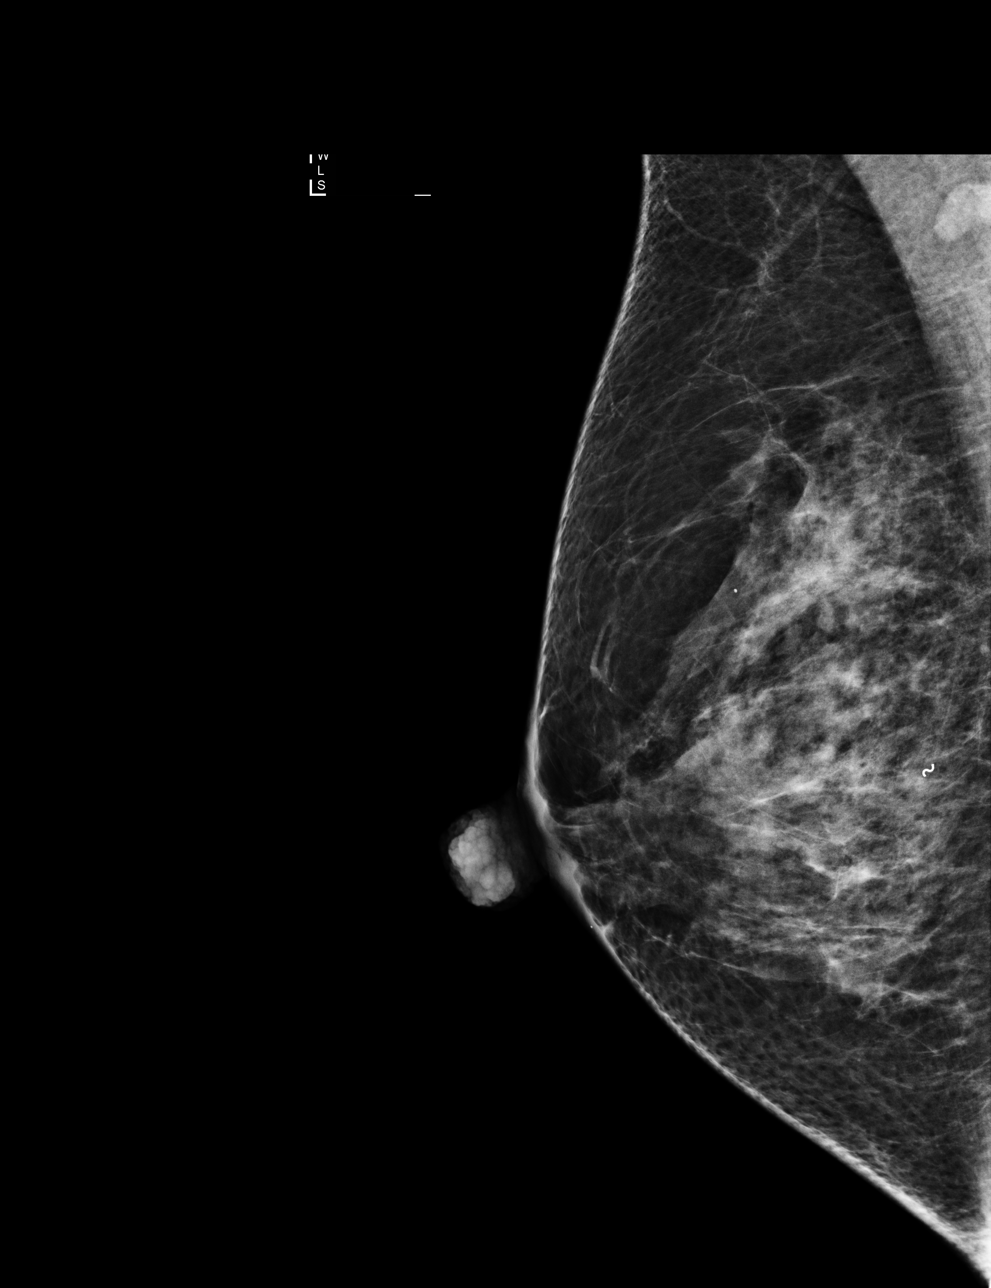

[L CC]
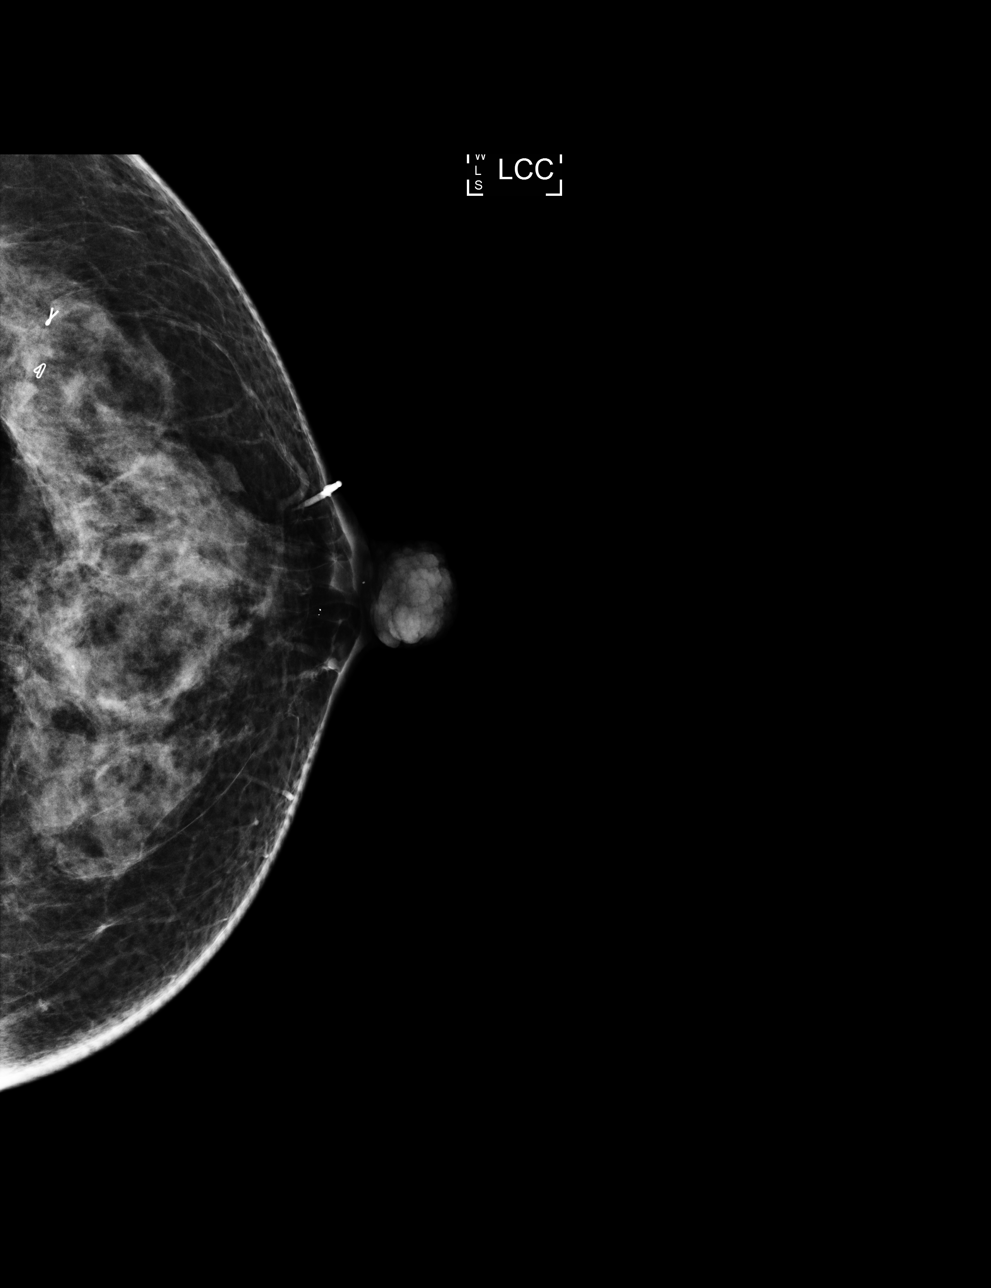

[4 of 4 positions shown; findings below may reference images not displayed]

ACR Breast Density Category d: The breast tissue is extremely dense,
which lowers the sensitivity of mammography
FINDINGS: There are no findings suspicious for malignancy. Images were
processed with CAD.
IMPRESSION: No mammographic evidence of malignancy. A result letter of this
screening mammogram will be mailed directly to the patient.

RECOMMENDATION:
Screening mammogram in one year. (Code:A5-2-HPS)

BI-RADS CATEGORY  1: Negative.

## 2020-05-06 ENCOUNTER — Other Ambulatory Visit: Payer: Self-pay | Admitting: Family Medicine

## 2020-05-06 DIAGNOSIS — K219 Gastro-esophageal reflux disease without esophagitis: Secondary | ICD-10-CM

## 2020-06-07 HISTORY — PX: COLONOSCOPY: SHX174

## 2020-06-24 ENCOUNTER — Other Ambulatory Visit: Payer: Self-pay | Admitting: Family Medicine

## 2020-06-24 DIAGNOSIS — Z1231 Encounter for screening mammogram for malignant neoplasm of breast: Secondary | ICD-10-CM

## 2020-08-06 ENCOUNTER — Other Ambulatory Visit: Payer: Self-pay

## 2020-08-06 ENCOUNTER — Ambulatory Visit
Admission: RE | Admit: 2020-08-06 | Discharge: 2020-08-06 | Disposition: A | Discharge status: Home / Self Care | Payer: BC Managed Care – PPO | Source: Ambulatory Visit | Attending: Family Medicine | Admitting: Family Medicine

## 2020-08-06 DIAGNOSIS — Z1231 Encounter for screening mammogram for malignant neoplasm of breast: Secondary | ICD-10-CM

## 2020-08-10 ENCOUNTER — Other Ambulatory Visit: Payer: Self-pay | Admitting: Family Medicine

## 2020-08-10 DIAGNOSIS — K219 Gastro-esophageal reflux disease without esophagitis: Secondary | ICD-10-CM

## 2020-08-11 ENCOUNTER — Other Ambulatory Visit: Payer: Self-pay | Admitting: Family Medicine

## 2020-08-11 DIAGNOSIS — R928 Other abnormal and inconclusive findings on diagnostic imaging of breast: Secondary | ICD-10-CM

## 2020-08-26 ENCOUNTER — Ambulatory Visit: Payer: BC Managed Care – PPO

## 2020-08-26 ENCOUNTER — Ambulatory Visit
Admission: RE | Admit: 2020-08-26 | Discharge: 2020-08-26 | Disposition: A | Discharge status: Home / Self Care | Payer: BC Managed Care – PPO | Source: Ambulatory Visit | Attending: Family Medicine | Admitting: Family Medicine

## 2020-08-26 ENCOUNTER — Other Ambulatory Visit: Payer: Self-pay

## 2020-08-26 DIAGNOSIS — R928 Other abnormal and inconclusive findings on diagnostic imaging of breast: Secondary | ICD-10-CM

## 2020-09-04 ENCOUNTER — Other Ambulatory Visit: Payer: Self-pay

## 2020-09-04 ENCOUNTER — Other Ambulatory Visit: Payer: Self-pay | Admitting: Family Medicine

## 2020-09-04 ENCOUNTER — Encounter: Payer: Self-pay | Admitting: Family Medicine

## 2020-09-04 ENCOUNTER — Ambulatory Visit (INDEPENDENT_AMBULATORY_CARE_PROVIDER_SITE_OTHER): Payer: BC Managed Care – PPO | Admitting: Family Medicine

## 2020-09-04 VITALS — BP 110/68 | HR 72 | Temp 97.0°F | Wt 170.0 lb

## 2020-09-04 DIAGNOSIS — M25562 Pain in left knee: Secondary | ICD-10-CM | POA: Diagnosis not present

## 2020-09-04 DIAGNOSIS — Z0000 Encounter for general adult medical examination without abnormal findings: Secondary | ICD-10-CM

## 2020-09-04 DIAGNOSIS — H10502 Unspecified blepharoconjunctivitis, left eye: Secondary | ICD-10-CM

## 2020-09-04 DIAGNOSIS — G9389 Other specified disorders of brain: Secondary | ICD-10-CM

## 2020-09-04 DIAGNOSIS — K219 Gastro-esophageal reflux disease without esophagitis: Secondary | ICD-10-CM

## 2020-09-04 LAB — COMPREHENSIVE METABOLIC PANEL
ALT: 34 U/L (ref 0–35)
AST: 19 U/L (ref 0–37)
Albumin: 4.6 g/dL (ref 3.5–5.2)
Alkaline Phosphatase: 105 U/L (ref 39–117)
BUN: 15 mg/dL (ref 6–23)
CO2: 30 mEq/L (ref 19–32)
Calcium: 9.8 mg/dL (ref 8.4–10.5)
Chloride: 102 mEq/L (ref 96–112)
Creatinine, Ser: 0.85 mg/dL (ref 0.40–1.20)
GFR: 80.53 mL/min (ref >60.00)
Glucose, Bld: 95 mg/dL (ref 70–99)
Potassium: 4.2 mEq/L (ref 3.5–5.1)
Sodium: 139 mEq/L (ref 135–145)
Total Bilirubin: 0.5 mg/dL (ref 0.2–1.2)
Total Protein: 7.4 g/dL (ref 6.0–8.3)

## 2020-09-04 LAB — CBC
HCT: 38.5 % (ref 36.0–46.0)
Hemoglobin: 12.6 g/dL (ref 12.0–15.0)
MCHC: 32.8 g/dL (ref 30.0–36.0)
MCV: 81.3 fl (ref 78.0–100.0)
Platelets: 388 10*3/uL (ref 150.0–400.0)
RBC: 4.73 Mil/uL (ref 3.87–5.11)
RDW: 15.4 % (ref 11.5–15.5)
WBC: 7.7 10*3/uL (ref 4.0–10.5)

## 2020-09-04 LAB — LIPID PANEL
Cholesterol: 254 mg/dL — ABNORMAL HIGH (ref 0–200)
HDL: 69.8 mg/dL (ref >39.00)
LDL Cholesterol: 161 mg/dL — ABNORMAL HIGH (ref 0–99)
NonHDL: 183.72
Total CHOL/HDL Ratio: 4
Triglycerides: 116 mg/dL (ref 0.0–149.0)
VLDL: 23.2 mg/dL (ref 0.0–40.0)

## 2020-09-04 LAB — URINALYSIS, ROUTINE W REFLEX MICROSCOPIC
Bilirubin Urine: NEGATIVE
Hgb urine dipstick: NEGATIVE
Ketones, ur: NEGATIVE
Nitrite: NEGATIVE
RBC / HPF: NONE SEEN (ref 0–?)
Specific Gravity, Urine: 1.02 (ref 1.000–1.030)
Total Protein, Urine: NEGATIVE
Urine Glucose: NEGATIVE
Urobilinogen, UA: 0.2 (ref 0.0–1.0)
pH: 5.5 (ref 5.0–8.0)

## 2020-09-04 LAB — HEMOGLOBIN A1C: Hgb A1c MFr Bld: 5.8 % (ref 4.6–6.5)

## 2020-09-04 MED ORDER — SULFACETAMIDE SODIUM 10 % OP SOLN: 1.0000 [drp] | Freq: Four times a day (QID) | OPHTHALMIC | 0 refills | Status: DC

## 2020-09-04 NOTE — Patient Instructions (Signed)
Health Maintenance, Female Adopting a healthy lifestyle and getting preventive care are important in promoting health and wellness. Ask your health care provider about:  The right schedule for you to have regular tests and exams.  Things you can do on your own to prevent diseases and keep yourself healthy. What should I know about diet, weight, and exercise? Eat a healthy diet  Eat a diet that includes plenty of vegetables, fruits, low-fat dairy products, and lean protein.  Do not eat a lot of foods that are high in solid fats, added sugars, or sodium.   Maintain a healthy weight Body mass index (BMI) is used to identify weight problems. It estimates body fat based on height and weight. Your health care provider can help determine your BMI and help you achieve or maintain a healthy weight. Get regular exercise Get regular exercise. This is one of the most important things you can do for your health. Most adults should:  Exercise for at least 150 minutes each week. The exercise should increase your heart rate and make you sweat (moderate-intensity exercise).  Do strengthening exercises at least twice a week. This is in addition to the moderate-intensity exercise.  Spend less time sitting. Even light physical activity can be beneficial. Watch cholesterol and blood lipids Have your blood tested for lipids and cholesterol at 50 years of age, then have this test every 5 years. Have your cholesterol levels checked more often if:  Your lipid or cholesterol levels are high.  You are older than 50 years of age.  You are at high risk for heart disease. What should I know about cancer screening? Depending on your health history and family history, you may need to have cancer screening at various ages. This may include screening for:  Breast cancer.  Cervical cancer.  Colorectal cancer.  Skin cancer.  Lung cancer. What should I know about heart disease, diabetes, and high blood  pressure? Blood pressure and heart disease  High blood pressure causes heart disease and increases the risk of stroke. This is more likely to develop in people who have high blood pressure readings, are of African descent, or are overweight.  Have your blood pressure checked: ? Every 3-5 years if you are 59-59 years of age. ? Every year if you are 63 years old or older. Diabetes Have regular diabetes screenings. This checks your fasting blood sugar level. Have the screening done:  Once every three years after age 58 if you are at a normal weight and have a low risk for diabetes.  More often and at a younger age if you are overweight or have a high risk for diabetes. What should I know about preventing infection? Hepatitis B If you have a higher risk for hepatitis B, you should be screened for this virus. Talk with your health care provider to find out if you are at risk for hepatitis B infection. Hepatitis C Testing is recommended for:  Everyone born from [birth year] through 1965.  Anyone with known risk factors for hepatitis C. Sexually transmitted infections (STIs)  Get screened for STIs, including gonorrhea and chlamydia, if: ? You are sexually active and are younger than 50 years of age. ? You are older than 50 years of age and your health care provider tells you that you are at risk for this type of infection. ? Your sexual activity has changed since you were last screened, and you are at increased risk for chlamydia or gonorrhea. Ask your health care provider  if you are at risk.  Ask your health care provider about whether you are at high risk for HIV. Your health care provider may recommend a prescription medicine to help prevent HIV infection. If you choose to take medicine to prevent HIV, you should first get tested for HIV. You should then be tested every 3 months for as long as you are taking the medicine. Pregnancy  If you are about to stop having your period (premenopausal) and  you may become pregnant, seek counseling before you get pregnant.  Take 400 to 800 micrograms (mcg) of folic acid every day if you become pregnant.  Ask for birth control (contraception) if you want to prevent pregnancy. Osteoporosis and menopause Osteoporosis is a disease in which the bones lose minerals and strength with aging. This can result in bone fractures. If you are 65 years old or older, or if you are at risk for osteoporosis and fractures, ask your health care provider if you should:  Be screened for bone loss.  Take a calcium or vitamin D supplement to lower your risk of fractures.  Be given hormone replacement therapy (HRT) to treat symptoms of menopause. Follow these instructions at home: Lifestyle  Do not use any products that contain nicotine or tobacco, such as cigarettes, e-cigarettes, and chewing tobacco. If you need help quitting, ask your health care provider.  Do not use street drugs.  Do not share needles.  Ask your health care provider for help if you need support or information about quitting drugs. Alcohol use  Do not drink alcohol if: ? Your health care provider tells you not to drink. ? You are pregnant, may be pregnant, or are planning to become pregnant.  If you drink alcohol: ? Limit how much you use to 0-1 drink a day. ? Limit intake if you are breastfeeding.  Be aware of how much alcohol is in your drink. In the [place] one drink equals one 12 oz bottle of beer (355 mL), one 5 oz glass of wine (148 mL), or one 1 oz glass of hard liquor (44 mL). General instructions  Schedule regular health, dental, and eye exams.  Stay current with your vaccines.  Tell your health care provider if: ? You often feel depressed. ? You have ever been abused or do not feel safe at home. Summary  Adopting a healthy lifestyle and getting preventive care are important in promoting health and wellness.  Follow your health care provider's instructions about healthy  diet, exercising, and getting tested or screened for diseases.  Follow your health care provider's instructions on monitoring your cholesterol and blood pressure. This information is not intended to replace advice given to you by your health care provider. Make sure you discuss any questions you have with your health care provider. Document Revised: 05/17/2018 Document Reviewed: 05/17/2018 Elsevier Patient Education  2021 Else[employer] Inc.  Preventive Care 40-64 Years Old, Female Preventive care refers to lifestyle choices and visits with your health care provider that can promote health and wellness. This includes:  A yearly physical exam. This is also called an annual wellness visit.  Regular dental and eye exams.  Immunizations.  Screening for certain conditions.  Healthy lifestyle choices, such as: ? Eating a healthy diet. ? Getting regular exercise. ? Not using drugs or products that contain nicotine and tobacco. ? Limiting alcohol use. What can I expect for my preventive care visit? Physical exam Your health care provider will check your:  Height and weight. These may   be used to calculate your BMI (body mass index). BMI is a measurement that tells if you are at a healthy weight.  Heart rate and blood pressure.  Body temperature.  Skin for abnormal spots. Counseling Your health care provider may ask you questions about your:  Past medical problems.  Family's medical history.  Alcohol, tobacco, and drug use.  Emotional well-being.  Home life and relationship well-being.  Sexual activity.  Diet, exercise, and sleep habits.  Work and work Statistician.  Access to firearms.  Method of birth control.  Menstrual cycle.  Pregnancy history. What immunizations do I need? Vaccines are usually given at various ages, according to a schedule. Your health care provider will recommend vaccines for you based on your age, medical history, and lifestyle or other factors,  such as travel or where you work.   What tests do I need? Blood tests  Lipid and cholesterol levels. These may be checked every 5 years, or more often if you are over 65 years old.  Hepatitis C test.  Hepatitis B test. Screening  Lung cancer screening. You may have this screening every year starting at age 90 if you have a 30-pack-year history of smoking and currently smoke or have quit within the past 15 years.  Colorectal cancer screening. ? All adults should have this screening starting at age 16 and continuing until age 38. ? Your health care provider may recommend screening at age 30 if you are at increased risk. ? You will have tests every 1-10 years, depending on your results and the type of screening test.  Diabetes screening. ? This is done by checking your blood sugar (glucose) after you have not eaten for a while (fasting). ? You may have this done every 1-3 years.  Mammogram. ? This may be done every 1-2 years. ? Talk with your health care provider about when you should start having regular mammograms. This may depend on whether you have a family history of breast cancer.  BRCA-related cancer screening. This may be done if you have a family history of breast, ovarian, tubal, or peritoneal cancers.  Pelvic exam and Pap test. ? This may be done every 3 years starting at age 58. ? Starting at age 60, this may be done every 5 years if you have a Pap test in combination with an HPV test. Other tests  STD (sexually transmitted disease) testing, if you are at risk.  Bone density scan. This is done to screen for osteoporosis. You may have this scan if you are at high risk for osteoporosis. Talk with your health care provider about your test results, treatment options, and if necessary, the need for more tests. Follow these instructions at home: Eating and drinking  Eat a diet that includes fresh fruits and vegetables, whole grains, lean protein, and low-fat dairy  products.  Take vitamin and mineral supplements as recommended by your health care provider.  Do not drink alcohol if: ? Your health care provider tells you not to drink. ? You are pregnant, may be pregnant, or are planning to become pregnant.  If you drink alcohol: ? Limit how much you have to 0-1 drink a day. ? Be aware of how much alcohol is in your drink. In the [place] one drink equals one 12 oz bottle of beer (355 mL), one 5 oz glass of wine (148 mL), or one 1 oz glass of hard liquor (44 mL).   Lifestyle  Take daily care of your teeth and  gums. Brush your teeth every morning and night with fluoride toothpaste. Floss one time each day.  Stay active. Exercise for at least 30 minutes 5 or more days each week.  Do not use any products that contain nicotine or tobacco, such as cigarettes, e-cigarettes, and chewing tobacco. If you need help quitting, ask your health care provider.  Do not use drugs.  If you are sexually active, practice safe sex. Use a condom or other form of protection to prevent STIs (sexually transmitted infections).  If you do not wish to become pregnant, use a form of birth control. If you plan to become pregnant, see your health care provider for a prepregnancy visit.  If told by your health care provider, take low-dose aspirin daily starting at age 61.  Find healthy ways to cope with stress, such as: ? Meditation, yoga, or listening to music. ? Journaling. ? Talking to a trusted person. ? Spending time with friends and family. Safety  Always wear your seat belt while driving or riding in a vehicle.  Do not drive: ? If you have been drinking alcohol. Do not ride with someone who has been drinking. ? When you are tired or distracted. ? While texting.  Wear a helmet and other protective equipment during sports activities.  If you have firearms in your house, make sure you follow all gun safety procedures. What's next?  Visit your health care provider  once a year for an annual wellness visit.  Ask your health care provider how often you should have your eyes and teeth checked.  Stay up to date on all vaccines. This information is not intended to replace advice given to you by your health care provider. Make sure you discuss any questions you have with your health care provider. Document Revised: 02/26/2020 Document Reviewed: 02/02/2018 Elsevier Patient Education  2021 [employer].

## 2020-09-04 NOTE — Progress Notes (Addendum)
Established Patient Office Visit  Subjective:  Patient ID: Melinda Mccormick, female    DOB: 12/27/1970  Age: 50 y.o. MRN: [id number]  CC:  Chief Complaint  Patient presents with  . Annual Exam    Pt is here for physical, pt would like to address stye on left eye     HPI [name] presents for a physical and wellness check.  Mostly doing well.  There is some irritation in the left lower eyelid.  Vision is not affected.  Ongoing left knee pain.  Status post sports medicine ultrasound.  She is using Voltaren gel with good relief.  Menses are becoming irregular pends spaced out.  Older sister and mom developed menopause in their late 68s early 10s.  Exercising by walking.   Past Medical History:  Diagnosis Date  . Arthritis   . Migraines     Past Surgical History:  Procedure Laterality Date  . BREAST BIOPSY Right   . BREAST BIOPSY Left   . BREAST EXCISIONAL BIOPSY Left   . SALPINGECTOMY      Family History  Problem Relation Age of Onset  . Arthritis Mother   . Diabetes Mother   . Hyperlipidemia Mother   . Hypertension Mother   . Miscarriages / [place] Mother   . Arthritis Father   . Stroke Father   . Asthma Father   . Hyperlipidemia Father   . Miscarriages / Stillbirths Sister   . Asthma Sister   . Thyroid cancer Sister   . Arthritis Maternal Grandmother   . Heart attack Maternal Grandmother   . Early death Maternal Grandfather   . Heart attack Maternal Grandfather   . Arthritis Paternal Grandmother   . Heart attack Paternal Grandmother   . Heart attack Paternal Grandfather   . Asthma Sister   . Stomach cancer Paternal Uncle   . Leukemia Niece   . Colon cancer Neg Hx   . Esophageal cancer Neg Hx     Social History   Socioeconomic History  . Marital status: Significant Other    Spouse name: Not on file  . Number of children: Not on file  . Years of education: Not on file  . Highest education level: Not on file  Occupational History  . Not on  file  Tobacco Use  . Smoking status: Former Research scientist (life sciences)  . Smokeless tobacco: Never Used  . Tobacco comment: smoke 2 years in college. said 2 cigarettes per day. Over 25 years ago  Substance and Sexual Activity  . Alcohol use: Yes    Comment: social  . Drug use: Never  . Sexual activity: Not on file  Other Topics Concern  . Not on file  Social History Narrative  . Not on file   Social Determinants of Health   Financial Resource Strain: Not on file  Food Insecurity: Not on file  Transportation Needs: Not on file  Physical Activity: Not on file  Stress: Not on file  Social Connections: Not on file  Intimate Partner Violence: Not on file    Outpatient Medications Prior to Visit  Medication Sig Dispense Refill  . Ascorbic Acid (VITAMIN C) 1000 MG tablet Take 1,000 mg by mouth daily.    Marland Kitchen glucosamine-chondroitin 500-400 MG tablet Take 1 tablet by mouth 2 (two) times daily.    Marland Kitchen omega-3 acid ethyl esters (LOVAZA) 1 g capsule Take by mouth 2 (two) times daily.    . pantoprazole (PROTONIX) 40 MG tablet Take 1 tablet by mouth  once daily 30 tablet 0   No facility-administered medications prior to visit.    No Known Allergies  ROS Review of Systems  Constitutional: Negative.   HENT: Negative.   Eyes: Positive for redness. Negative for photophobia and visual disturbance.  Respiratory: Negative.   Cardiovascular: Negative.   Gastrointestinal: Negative.   Endocrine: Negative for polyphagia and polyuria.  Genitourinary: Negative.   Musculoskeletal: Positive for arthralgias.  Skin: Negative.   Neurological: Negative for seizures, speech difficulty, weakness, numbness and headaches.  Psychiatric/Behavioral: Negative.       Objective:    Physical Exam Nursing note reviewed.  Constitutional:      General: She is not in acute distress.    Appearance: Normal appearance. She is not ill-appearing, toxic-appearing or diaphoretic.  HENT:     Head: Normocephalic and atraumatic.      Right Ear: Tympanic membrane, ear canal and external ear normal.     Left Ear: Tympanic membrane, ear canal and external ear normal.     Mouth/Throat:     Mouth: Mucous membranes are moist.     Pharynx: Oropharynx is clear. No oropharyngeal exudate or posterior oropharyngeal erythema.  Eyes:     General: No scleral icterus.    Extraocular Movements: Extraocular movements intact.     Conjunctiva/sclera:     Left eye: Left conjunctiva is injected.     Pupils: Pupils are equal, round, and reactive to light.  Cardiovascular:     Rate and Rhythm: Normal rate and regular rhythm.  Pulmonary:     Effort: Pulmonary effort is normal.     Breath sounds: Normal breath sounds.  Abdominal:     General: Bowel sounds are normal.  Musculoskeletal:        General: Normal range of motion.     Cervical back: No rigidity or tenderness.     Left knee: No swelling or bony tenderness. No tenderness.  Lymphadenopathy:     Cervical: No cervical adenopathy.  Skin:    General: Skin is warm and dry.  Neurological:     Mental Status: She is alert and oriented to person, place, and time.     Motor: No weakness.  Psychiatric:        Mood and Affect: Mood normal.        Behavior: Behavior normal.     BP 110/68 (BP Location: Left Arm, Patient Position: Sitting, Cuff Size: Normal)   Pulse 72   Temp (!) 97 F (36.1 C) (Temporal)   Wt 170 lb (77.1 kg)   SpO2 96%   BMI 28.29 kg/m  Wt Readings from Last 3 Encounters:  09/04/20 170 lb (77.1 kg)  08/31/19 169 lb 3.2 oz (76.7 kg)  10/25/18 155 lb (70.3 kg)   The 10-year ASCVD risk score Mikey Bussing DC Jr., et al., 2013) is: 0.9%   Values used to calculate the score:     Age: 26 years     Sex: Female     Is Non-Hispanic African American: No     Diabetic: No     Tobacco smoker: No     Systolic Blood Pressure: 540 mmHg     Is BP treated: No     HDL Cholesterol: 69.8 mg/dL     Total Cholesterol: 254 mg/dL  Health Maintenance Due  Topic Date Due  . HIV  Screening  Never done  . TETANUS/TDAP  Never done  . COLONOSCOPY (Pts 45-38yrs Insurance coverage will need to be confirmed)  Never done  . INFLUENZA  VACCINE  01/06/2020  . COVID-19 Vaccine (3 - Booster for Pfizer series) 03/17/2020    There are no preventive care reminders to display for this patient.  Lab Results  Component Value Date   TSH 2.71 08/31/2019   Lab Results  Component Value Date   WBC 7.0 08/31/2019   HGB 12.2 08/31/2019   HCT 36.4 08/31/2019   MCV 81.2 08/31/2019   PLT 371.0 08/31/2019   Lab Results  Component Value Date   NA 136 08/31/2019   K 4.2 08/31/2019   CO2 28 08/31/2019   GLUCOSE 93 08/31/2019   BUN 10 08/31/2019   CREATININE 0.71 08/31/2019   BILITOT 0.5 08/31/2019   ALKPHOS 117 08/31/2019   AST 32 08/31/2019   ALT 61 (H) 08/31/2019   PROT 6.9 08/31/2019   ALBUMIN 4.3 08/31/2019   CALCIUM 9.5 08/31/2019   GFR 87.77 08/31/2019   Lab Results  Component Value Date   CHOL 243 (H) 08/31/2019   Lab Results  Component Value Date   HDL 59.40 08/31/2019   Lab Results  Component Value Date   LDLCALC 157 (H) 08/31/2019   Lab Results  Component Value Date   TRIG 135.0 08/31/2019   Lab Results  Component Value Date   CHOLHDL 4 08/31/2019   No results found for: HGBA1C    The 10-year ASCVD risk score Mikey Bussing DC Jr., et al., 2013) is: 0.9%   Values used to calculate the score:     Age: 24 years     Sex: Female     Is Non-Hispanic African American: No     Diabetic: No     Tobacco smoker: No     Systolic Blood Pressure: 244 mmHg     Is BP treated: No     HDL Cholesterol: 69.8 mg/dL     Total Cholesterol: 254 mg/dL Assessment & Plan:   Problem List Items Addressed This Visit      Other   Healthcare maintenance - Primary   Relevant Orders   CBC   Comprehensive metabolic panel   Lipid panel   Urinalysis, Routine w reflex microscopic   Hemoglobin A1c   Ambulatory referral to Gastroenterology    Other Visit Diagnoses    Left  frontal lobe mass       Relevant Orders   MR Brain W Wo Contrast   Blepharoconjunctivitis of left eye, unspecified blepharoconjunctivitis type       Relevant Medications   sulfacetamide (BLEPH-10) 10 % ophthalmic solution   Left knee pain, unspecified chronicity       Relevant Orders   DG Knee Complete 4 Views Left      Meds ordered this encounter  Medications  . sulfacetamide (BLEPH-10) 10 % ophthalmic solution    Sig: Place 1 drop into the left eye 4 (four) times daily.    Dispense:  15 mL    Refill:  0    Follow-up: Return in about 1 year (around 09/04/2021), or if symptoms worsen or fail to improve.  Given information on health maintenance and disease prevention.  She will follow-up with her GYN for consideration of perimenopausal hormone supplementation.  Agrees to go for her first colonoscopy MRI follow-up of left frontal lobe lesion.  Checking x-ray of left knee with ongoing discomfort.  Continue Voltaren gel.  May need to follow back up with sports medicine.    Libby Maw, MD

## 2020-09-05 ENCOUNTER — Telehealth: Payer: Self-pay | Admitting: Family Medicine

## 2020-09-05 NOTE — Telephone Encounter (Signed)
Pt is wanting a cb from you, she says it is concerning a form that she is needing filled out. Please advise at [phone].

## 2020-09-06 ENCOUNTER — Encounter: Payer: Self-pay | Admitting: Family Medicine

## 2020-09-13 ENCOUNTER — Ambulatory Visit (HOSPITAL_BASED_OUTPATIENT_CLINIC_OR_DEPARTMENT_OTHER)
Admission: RE | Admit: 2020-09-13 | Discharge: 2020-09-13 | Disposition: A | Discharge status: Home / Self Care | Payer: BC Managed Care – PPO | Source: Ambulatory Visit | Attending: Family Medicine | Admitting: Family Medicine

## 2020-09-13 ENCOUNTER — Other Ambulatory Visit: Payer: Self-pay

## 2020-09-13 DIAGNOSIS — G9389 Other specified disorders of brain: Secondary | ICD-10-CM | POA: Diagnosis present

## 2020-09-13 MED ORDER — GADOBUTROL 1 MMOL/ML IV SOLN
7.5000 mL | Freq: Once | INTRAVENOUS | Status: AC | PRN
  Administered 2020-09-13: 7.5 mL via INTRAVENOUS

## 2020-09-15 ENCOUNTER — Encounter: Payer: Self-pay | Admitting: Family Medicine

## 2020-09-15 DIAGNOSIS — H10502 Unspecified blepharoconjunctivitis, left eye: Secondary | ICD-10-CM

## 2020-09-15 NOTE — Telephone Encounter (Signed)
I think that it might be more of a chemosis or swelling of the conjunctiva. Have asked for ophthalmology appointment for this week.

## 2020-09-24 ENCOUNTER — Telehealth: Payer: Self-pay | Admitting: Family Medicine

## 2020-09-24 NOTE — Telephone Encounter (Signed)
Pt called and asked about form she had dropped off previously and that she had to come by yesterday yo sign. Burley Saver put in Dr Avel Peace folder up front and pt just requested a mychart message to let her know once it has been faxed

## 2020-09-26 NOTE — Telephone Encounter (Signed)
Done

## 2020-10-26 ENCOUNTER — Encounter: Payer: Self-pay | Admitting: Family Medicine

## 2020-10-30 ENCOUNTER — Ambulatory Visit (AMBULATORY_SURGERY_CENTER): Payer: Self-pay

## 2020-10-30 ENCOUNTER — Other Ambulatory Visit: Payer: Self-pay

## 2020-10-30 VITALS — Ht 66.0 in | Wt 171.0 lb

## 2020-10-30 DIAGNOSIS — Z1211 Encounter for screening for malignant neoplasm of colon: Secondary | ICD-10-CM

## 2020-10-30 MED ORDER — SUTAB 1479-225-188 MG PO TABS: 1.0000 | ORAL_TABLET | ORAL | 0 refills | Status: DC

## 2020-10-30 NOTE — Progress Notes (Signed)
No egg or soy allergy known to patient  No issues with past sedation with any surgeries or procedures Patient denies ever being told they had issues or difficulty with intubation  No FH of Malignant Hyperthermia No diet pills per patient No home 02 use per patient  No blood thinners per patient  Pt denies issues with constipation at this time; No A fib or A flutter  EMMI video via [place] 19 guidelines implemented in PV today with Pt and RN  Pt is fully vaccinated for Covid  Due to the COVID-19 pandemic we are asking patients to follow certain guidelines.  Pt aware of COVID protocols and [facility] guidelines  Coupon given to pt in PV today, Code to Pharmacy and NO PA's for preps discussed with pt In PV today  Discussed with pt there will be an out-of-pocket cost for prep and that varies from $0 to 70 dollars

## 2020-11-11 ENCOUNTER — Encounter: Payer: Self-pay | Admitting: Family Medicine

## 2020-11-11 ENCOUNTER — Encounter: Payer: Self-pay | Admitting: Gastroenterology

## 2020-11-14 ENCOUNTER — Other Ambulatory Visit: Payer: Self-pay

## 2020-11-14 ENCOUNTER — Ambulatory Visit (AMBULATORY_SURGERY_CENTER): Payer: BC Managed Care – PPO | Admitting: Gastroenterology

## 2020-11-14 ENCOUNTER — Encounter: Payer: Self-pay | Admitting: Gastroenterology

## 2020-11-14 VITALS — BP 115/62 | HR 62 | Temp 97.7°F | Resp 17 | Ht 66.0 in | Wt 171.0 lb

## 2020-11-14 DIAGNOSIS — Z1211 Encounter for screening for malignant neoplasm of colon: Secondary | ICD-10-CM | POA: Diagnosis not present

## 2020-11-14 DIAGNOSIS — K640 First degree hemorrhoids: Secondary | ICD-10-CM

## 2020-11-14 DIAGNOSIS — D120 Benign neoplasm of cecum: Secondary | ICD-10-CM

## 2020-11-14 DIAGNOSIS — K5730 Diverticulosis of large intestine without perforation or abscess without bleeding: Secondary | ICD-10-CM

## 2020-11-14 HISTORY — PX: COLONOSCOPY: SHX174

## 2020-11-14 MED ORDER — SODIUM CHLORIDE 0.9 % IV SOLN: 500.0000 mL | Freq: Once | INTRAVENOUS | Status: DC

## 2020-11-14 NOTE — Progress Notes (Signed)
Called to room to assist during endoscopic procedure.  Patient ID and intended procedure confirmed with present staff. Received instructions for my participation in the procedure from the performing physician.  

## 2020-11-14 NOTE — Progress Notes (Signed)
To pacu, VSS. Report to [email or web address] °

## 2020-11-14 NOTE — Op Note (Signed)
[place] Patient Name: Melinda Mccormick Procedure Date: 11/14/2020 10:57 AM MRN: [id number] Endoscopist: Gerrit Heck , MD Age: 50 Referring MD:  Date of Birth: 1971-02-18 Gender: Female Account #: [id number] Procedure:                Colonoscopy Indications:              Screening for colorectal malignant neoplasm, This                            is the patient's first colonoscopy Medicines:                Monitored Anesthesia Care Procedure:                Pre-Anesthesia Assessment:                           - Prior to the procedure, a History and Physical                            was performed, and patient medications and                            allergies were reviewed. The patient's tolerance of                            previous anesthesia was also reviewed. The risks                            and benefits of the procedure and the sedation                            options and risks were discussed with the patient.                            All questions were answered, and informed consent                            was obtained. Prior Anticoagulants: The patient has                            taken no previous anticoagulant or antiplatelet                            agents. ASA Grade Assessment: II - A patient with                            mild systemic disease. After reviewing the risks                            and benefits, the patient was deemed in                            satisfactory condition to undergo the procedure.  After obtaining informed consent, the colonoscope                            was passed under direct vision. Throughout the                            procedure, the patient's blood pressure, pulse, and                            oxygen saturations were monitored continuously. The                            Olympus PFC-[id number] (#[id number]) Colonoscope was                            introduced through the  anus and advanced to the the                            terminal ileum. The colonoscopy was performed                            without difficulty. The patient tolerated the                            procedure well. The quality of the bowel                            preparation was good. The terminal ileum, ileocecal                            valve, appendiceal orifice, and rectum were                            photographed. Scope In: 11:12:53 AM Scope Out: 11:34:45 AM Scope Withdrawal Time: 0 hours 17 minutes 22 seconds  Total Procedure Duration: 0 hours 21 minutes 52 seconds  Findings:                 The perianal and digital rectal examinations were                            normal.                           Two sessile polyps were found in the cecum. The                            polyps were 4 to 6 mm in size. These polyps were                            removed with a cold snare. Resection and retrieval                            were complete. Estimated blood loss was minimal.  A few small-mouthed diverticula were found in the                            ascending colon.                           Non-bleeding internal hemorrhoids were found during                            retroflexion. The hemorrhoids were small.                           The terminal ileum appeared normal. Complications:            No immediate complications. Estimated Blood Loss:     Estimated blood loss was minimal. Impression:               - Two 4 to 6 mm polyps in the cecum, removed with a                            cold snare. Resected and retrieved.                           - Diverticulosis in the ascending colon.                           - Non-bleeding internal hemorrhoids.                           - The examined portion of the ileum was normal. Recommendation:           - Patient has a contact number available for                            emergencies. The signs and  symptoms of potential                            delayed complications were discussed with the                            patient. Return to normal activities tomorrow.                            Written discharge instructions were provided to the                            patient.                           - Resume previous diet.                           - Continue present medications.                           - Await pathology results.                           -  Repeat colonoscopy for surveillance based on                            pathology results.                           - Return to GI clinic PRN. Gerrit Heck, MD 11/14/2020 11:38:30 AM

## 2020-11-14 NOTE — Patient Instructions (Signed)
Information on polyps and diverticulosis given to you today.  Await pathology results.  Resume previous diet and medications.  YOU HAD AN ENDOSCOPIC PROCEDURE TODAY AT THE [facility]:   Refer to the procedure report that was given to you for any specific questions about what was found during the examination.  If the procedure report does not answer your questions, please call your gastroenterologist to clarify.  If you requested that your care partner not be given the details of your procedure findings, then the procedure report has been included in a sealed envelope for you to review at your convenience later.  YOU SHOULD EXPECT: Some feelings of bloating in the abdomen. Passage of more gas than usual.  Walking can help get rid of the air that was put into your GI tract during the procedure and reduce the bloating. If you had a lower endoscopy (such as a colonoscopy or flexible sigmoidoscopy) you may notice spotting of blood in your stool or on the toilet paper. If you underwent a bowel prep for your procedure, you may not have a normal bowel movement for a few days.  Please Note:  You might notice some irritation and congestion in your nose or some drainage.  This is from the oxygen used during your procedure.  There is no need for concern and it should clear up in a day or so.  SYMPTOMS TO REPORT IMMEDIATELY:   Following lower endoscopy (colonoscopy or flexible sigmoidoscopy):  Excessive amounts of blood in the stool  Significant tenderness or worsening of abdominal pains  Swelling of the abdomen that is new, acute  Fever of 100F or higher   For urgent or emergent issues, a gastroenterologist can be reached at any hour by calling [phone]. Do not use MyChart messaging for urgent concerns.    DIET:  We do recommend a small meal at first, but then you may proceed to your regular diet.  Drink plenty of fluids but you should avoid alcoholic beverages for 24  hours.  ACTIVITY:  You should plan to take it easy for the rest of today and you should NOT DRIVE or use heavy machinery until tomorrow (because of the sedation medicines used during the test).    FOLLOW UP: Our staff will call the number listed on your records 48-72 hours following your procedure to check on you and address any questions or concerns that you may have regarding the information given to you following your procedure. If we do not reach you, we will leave a message.  We will attempt to reach you two times.  During this call, we will ask if you have developed any symptoms of COVID 19. If you develop any symptoms (ie: fever, flu-like symptoms, shortness of breath, cough etc.) before then, please call [phone].  If you test positive for Covid 19 in the 2 weeks post procedure, please call and report this information to us.    If any biopsies were taken you will be contacted by phone or by letter within the next 1-3 weeks.  Please call us at [phone] if you have not heard about the biopsies in 3 weeks.    SIGNATURES/CONFIDENTIALITY: You and/or your care partner have signed paperwork which will be entered into your electronic medical record.  These signatures attest to the fact that that the information above on your After Visit Summary has been reviewed and is understood.  Full responsibility of the confidentiality of this discharge information lies with you and/or   your care-partner. 

## 2020-11-14 NOTE — Progress Notes (Signed)
Pt's states no medical or surgical changes since previsit or office visit. 

## 2020-11-18 ENCOUNTER — Telehealth: Payer: Self-pay | Admitting: *Deleted

## 2020-11-18 NOTE — Telephone Encounter (Signed)
  Follow up Call-  Call back number 11/14/2020  Post procedure Call Back phone  # [phone]  Permission to leave phone message Yes  Some recent data might be hidden     Patient questions:  Do you have a fever, pain , or abdominal swelling? No. Pain Score  0 *  Have you tolerated food without any problems? Yes.    Have you been able to return to your normal activities? Yes.    Do you have any questions about your discharge instructions: Diet   No. Medications  No. Follow up visit  No.  Do you have questions or concerns about your Care? No.  Actions: * If pain score is 4 or above: No action needed, pain <4.

## 2020-11-20 ENCOUNTER — Encounter: Payer: Self-pay | Admitting: Gastroenterology

## 2021-01-24 ENCOUNTER — Encounter: Payer: Self-pay | Admitting: Family Medicine

## 2021-02-04 ENCOUNTER — Other Ambulatory Visit: Payer: BC Managed Care – PPO

## 2021-02-19 ENCOUNTER — Encounter: Payer: Self-pay | Admitting: Family

## 2021-02-19 ENCOUNTER — Ambulatory Visit: Payer: BC Managed Care – PPO | Admitting: Family

## 2021-02-19 ENCOUNTER — Encounter: Payer: Self-pay | Admitting: Family Medicine

## 2021-02-19 ENCOUNTER — Other Ambulatory Visit: Payer: Self-pay

## 2021-02-19 VITALS — Ht 66.0 in

## 2021-02-19 DIAGNOSIS — J029 Acute pharyngitis, unspecified: Secondary | ICD-10-CM | POA: Diagnosis not present

## 2021-02-19 DIAGNOSIS — R52 Pain, unspecified: Secondary | ICD-10-CM | POA: Diagnosis not present

## 2021-02-19 DIAGNOSIS — U071 COVID-19: Secondary | ICD-10-CM

## 2021-02-19 DIAGNOSIS — H6692 Otitis media, unspecified, left ear: Secondary | ICD-10-CM

## 2021-02-19 LAB — POCT INFLUENZA A/B
Influenza A, POC: NEGATIVE
Influenza B, POC: NEGATIVE

## 2021-02-19 LAB — POCT RAPID STREP A (OFFICE): Rapid Strep A Screen: NEGATIVE

## 2021-02-19 MED ORDER — AMOXICILLIN-POT CLAVULANATE 875-125 MG PO TABS: 1.0000 | ORAL_TABLET | Freq: Two times a day (BID) | ORAL | 0 refills | Status: DC

## 2021-02-20 ENCOUNTER — Other Ambulatory Visit: Payer: Self-pay | Admitting: Family

## 2021-02-20 ENCOUNTER — Telehealth: Payer: Self-pay

## 2021-02-20 MED ORDER — TERBINAFINE HCL 1 % EX CREA: 1.0000 "application " | TOPICAL_CREAM | Freq: Two times a day (BID) | CUTANEOUS | 0 refills | Status: DC

## 2021-02-20 NOTE — Telephone Encounter (Signed)
Pt called regarding a prescription. [name] saw Melinda Mccormick yesterday. She stated that Melinda Mccormick was going to call in a cream, I do not see a cream called in or on the AVS. Please Advise.

## 2021-02-21 ENCOUNTER — Encounter: Payer: Self-pay | Admitting: Family Medicine

## 2021-02-21 LAB — NOVEL CORONAVIRUS, NAA: SARS-CoV-2, NAA: DETECTED — AB

## 2021-02-21 LAB — SARS-COV-2, NAA 2 DAY TAT

## 2021-02-22 NOTE — Progress Notes (Signed)
Acute Office Visit  Subjective:    Patient ID: Melinda Mccormick, female    DOB: 24-Jul-1970, 50 y.o.   MRN: [id number]  Chief Complaint  Patient presents with  . Sore Throat    She has taken Dayquil since Tuesday.   . Generalized Body Aches    Pt says that she began to have symptoms Tuesday. She complains very sensitive skin. She thinks this is related to a spider bite on the back of her right leg.   . Fever    101 has been the highest reading.   Marland Kitchen Headache    HPI Patient is in today with c/o sore throat, left ear pain, fever, cough, congestion x 2 days. She has been taking OTC meds w/o any relief. Has taken rapid antigen test at home that was negative.    Past Medical History:  Diagnosis Date  . Anemia    hx of  . Arthritis    bilateral kneees L>R  . GERD (gastroesophageal reflux disease)    on meds  . Migraines   . 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 BREAST BIOPSY Right   . BREAST BIOPSY Left   . BREAST EXCISIONAL BIOPSY Left     x 5  . OVARIAN CYST SURGERY  10/2019  . SALPINGECTOMY      Family History  Problem Relation Age of Onset  . Arthritis Mother   . Diabetes Mother   . Hyperlipidemia Mother   . Hypertension Mother   . Miscarriages / [place] Mother   . Arthritis Father   . Stroke Father   . Asthma Father   . Hyperlipidemia Father   . Miscarriages / Stillbirths Sister   . Asthma Sister   . Thyroid cancer Sister   . Arthritis Maternal Grandmother   . Heart attack Maternal Grandmother   . Early death Maternal Grandfather   . Heart attack Maternal Grandfather   . Arthritis Paternal Grandmother   . Heart attack Paternal Grandmother   . Heart attack Paternal Grandfather   . Asthma Sister   . Leukemia Niece   . Colon cancer Neg Hx   . Esophageal cancer Neg Hx   . Colon polyps Neg Hx   . Rectal cancer Neg Hx   . Stomach cancer Neg Hx     Social History    Socioeconomic History  . Marital status: Significant Other    Spouse name: Not on file  . Number of children: Not on file  . Years of education: Not on file  . Highest education level: Not on file  Occupational History  . Not on file  Tobacco Use  . Smoking status: Former  . Smokeless tobacco: Never  . Tobacco comments:    smoke 2 years in college. said 2 cigarettes per day. Over 25 years ago  Vaping Use  . Vaping Use: Never used  Substance and Sexual Activity  . Alcohol use: Yes    Alcohol/week: 2.0 standard drinks    Types: 2 Glasses of wine per week    Comment: 1-2 times per month  . Drug use: Never  . Sexual activity: Not on file  Other Topics Concern  . Not on file  Social History Narrative  . Not on file   Social Determinants of Health   Financial Resource Strain: Not on file  Food Insecurity: Not on file  Transportation  Needs: Not on file  Physical Activity: Not on file  Stress: Not on file  Social Connections: Not on file  Intimate Partner Violence: Not on file    Outpatient Medications Prior to Visit  Medication Sig Dispense Refill  . Ascorbic Acid (VITAMIN C) 1000 MG tablet Take 1,000 mg by mouth daily.    . Biotin w/ Vitamins C & E (HAIR/SKIN/NAILS PO) Take 2 tablets by mouth daily.    Marland Kitchen glucosamine-chondroitin 500-400 MG tablet Take 1 tablet by mouth 2 (two) times daily.    Marland Kitchen omega-3 acid ethyl esters (LOVAZA) 1 g capsule Take by mouth 2 (two) times daily.    . pantoprazole (PROTONIX) 40 MG tablet Take 1 tablet by mouth once daily (Patient taking differently: every other day.) 30 tablet 5  . Pseudoephedrine-APAP-DM (DAYQUIL MULTI-SYMPTOM COLD/FLU PO) Take by mouth.     No facility-administered medications prior to visit.    No Known Allergies  Review of Systems  Constitutional:  Positive for fatigue and fever.  HENT:  Positive for congestion, ear pain and sore throat.   Eyes: Negative.   Respiratory:  Positive for cough.   Cardiovascular:  Negative.   Genitourinary: Negative.   Musculoskeletal: Negative.   Skin: Negative.   Allergic/Immunologic: Negative.   Neurological: Negative.   Hematological: Negative.   Psychiatric/Behavioral: Negative.    All other systems reviewed and are negative.     Objective:    Physical Exam Vitals and nursing note reviewed.  Constitutional:      Appearance: She is well-developed.  HENT:     Right Ear: Tympanic membrane and ear canal normal.     Left Ear: Swelling and tenderness present. No drainage.     Mouth/Throat:     Pharynx: Posterior oropharyngeal erythema present. No pharyngeal swelling.  Cardiovascular:     Rate and Rhythm: Normal rate and regular rhythm.  Pulmonary:     Effort: Pulmonary effort is normal.     Breath sounds: Normal breath sounds.  Neurological:     General: No focal deficit present.     Mental Status: She is alert.  Psychiatric:        Mood and Affect: Mood normal.   Ht '5\' 6"'$  (1.676 m)   BMI 27.60 kg/m  Wt Readings from Last 3 Encounters:  11/14/20 171 lb (77.6 kg)  10/30/20 171 lb (77.6 kg)  09/04/20 170 lb (77.1 kg)    Health Maintenance Due  Topic Date Due  . HIV Screening  Never done  . TETANUS/TDAP  Never done  . COVID-19 Vaccine (3 - Booster for Pfizer series) 02/16/2020  . INFLUENZA VACCINE  01/05/2021    There are no preventive care reminders to display for this patient.   Lab Results  Component Value Date   TSH 2.71 08/31/2019   Lab Results  Component Value Date   WBC 7.7 09/04/2020   HGB 12.6 09/04/2020   HCT 38.5 09/04/2020   MCV 81.3 09/04/2020   PLT 388.0 09/04/2020   Lab Results  Component Value Date   NA 139 09/04/2020   K 4.2 09/04/2020   CO2 30 09/04/2020   GLUCOSE 95 09/04/2020   BUN 15 09/04/2020   CREATININE 0.85 09/04/2020   BILITOT 0.5 09/04/2020   ALKPHOS 105 09/04/2020   AST 19 09/04/2020   ALT 34 09/04/2020   PROT 7.4 09/04/2020   ALBUMIN 4.6 09/04/2020   CALCIUM 9.8 09/04/2020   GFR 80.53  09/04/2020   Lab Results  Component Value Date  CHOL 254 (H) 09/04/2020   Lab Results  Component Value Date   HDL 69.80 09/04/2020   Lab Results  Component Value Date   LDLCALC 161 (H) 09/04/2020   Lab Results  Component Value Date   TRIG 116.0 09/04/2020   Lab Results  Component Value Date   CHOLHDL 4 09/04/2020   Lab Results  Component Value Date   HGBA1C 5.8 09/04/2020       Assessment & Plan:   Problem List Items Addressed This Visit   None Visit Diagnoses     Pharyngitis, unspecified etiology    -  Primary   Relevant Orders   POCT rapid strep A (Completed)   Novel Coronavirus, NAA ([facility])   Acute left otitis media       Relevant Medications   amoxicillin-clavulanate (AUGMENTIN) 875-125 MG tablet   Body aches       Relevant Orders   POCT Influenza A/B (Completed)   Novel Coronavirus, NAA ([facility])   Novel Coronavirus, NAA ([facility]) (Completed)   COVID-19            Meds ordered this encounter  Medications  . amoxicillin-clavulanate (AUGMENTIN) 875-125 MG tablet    Sig: Take 1 tablet by mouth 2 (two) times daily.    Dispense:  20 tablet    Refill:  0   Plan: PCR COVID test sent will notify patient pending results. Flu negative and strep negative. Treat left OM with Augmentin 875 mg twice daily. Call the office with any questions or concerns.   Kennyth Arnold, FNP

## 2021-02-23 ENCOUNTER — Telehealth: Payer: Self-pay

## 2021-02-23 ENCOUNTER — Encounter: Payer: Self-pay | Admitting: Family Medicine

## 2021-02-23 NOTE — Telephone Encounter (Signed)
Pt called in requesting a work note. Pt saw [name] on 9/15. Pt stated she started having Covid symptoms on 9/13. Melinda Mccormick then had an appt on 9/15 and was tested. Results came back on 9/17 as positive. Melinda Mccormick stated that her work is keeping her out and she would like a work note of when she can go back. Pt stated that her work does quarantine from when symptoms start not when she tested positive. Please Advise.

## 2021-04-30 IMAGING — MG MM DIGITAL DIAGNOSTIC UNILAT*L* W/ TOMO W/ CAD
4 series · 4 of 12 positions shown · non-contrast
Comparison: Previous exam(s).

CLINICAL DATA: 49-year-old female for further evaluation of
possible LEFT breast asymmetry on screening mammogram.

EXAM:
DIGITAL DIAGNOSTIC UNILATERAL LEFT MAMMOGRAM WITH TOMOSYNTHESIS AND
CAD
TECHNIQUE: Left digital diagnostic mammography and breast tomosynthesis was
performed. The images were evaluated with computer-aided detection.

[L MLO synth-2D]
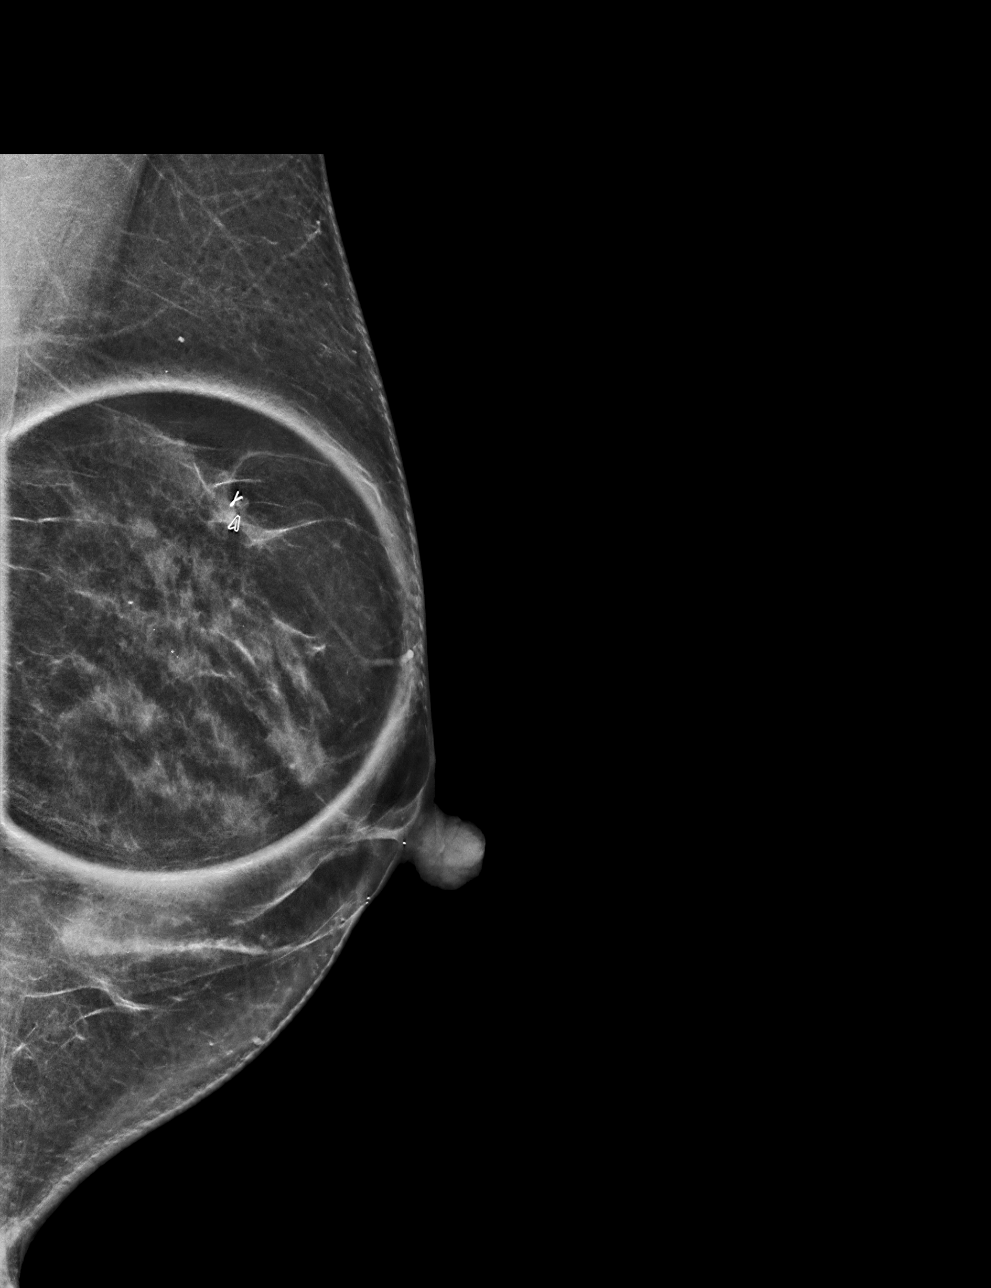

[L ML synth-2D]
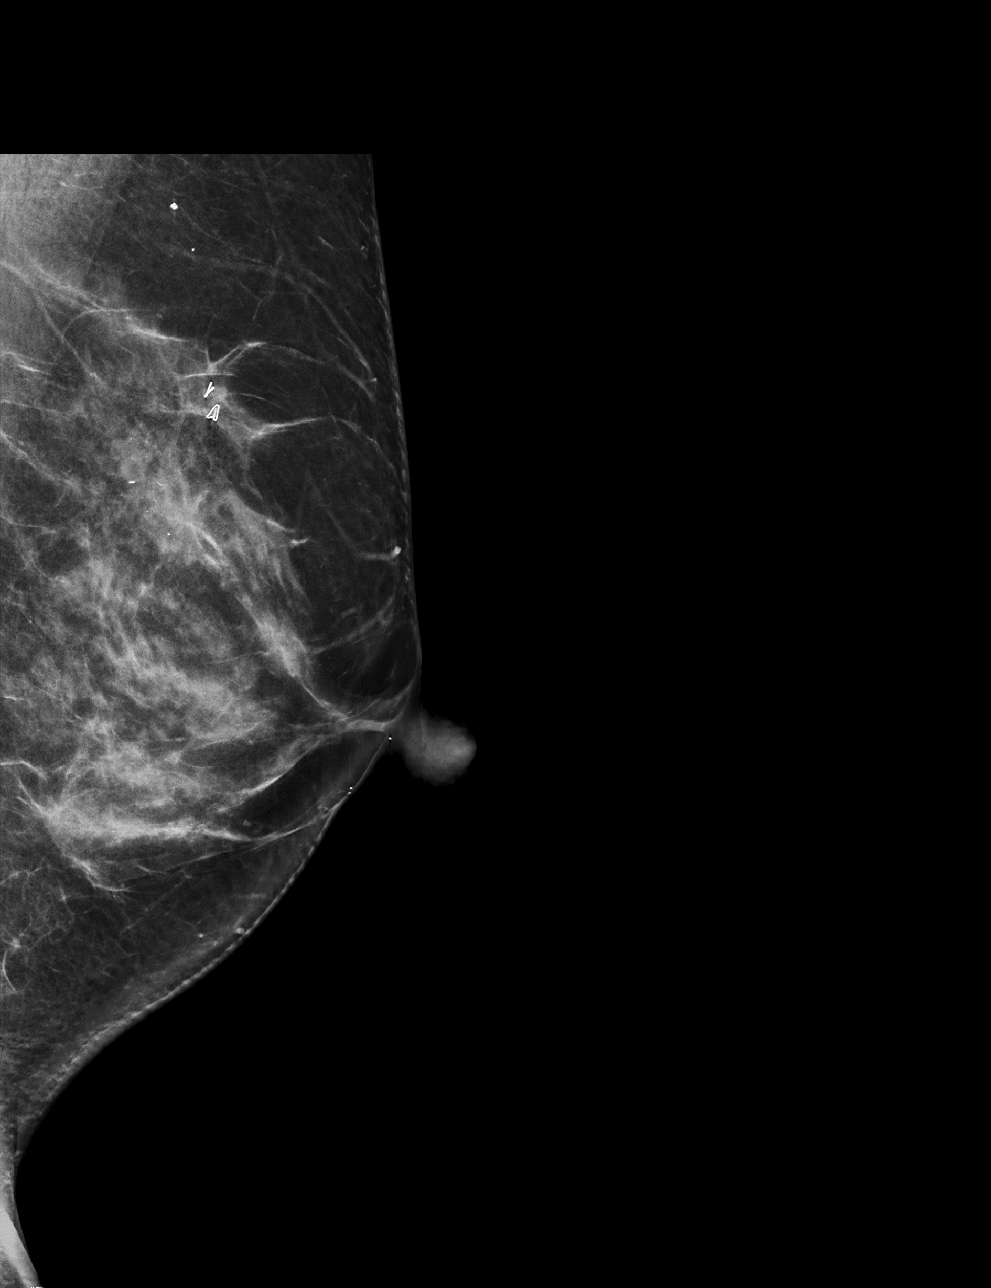

[L MLO tomo · tomo slice 33/66.0]
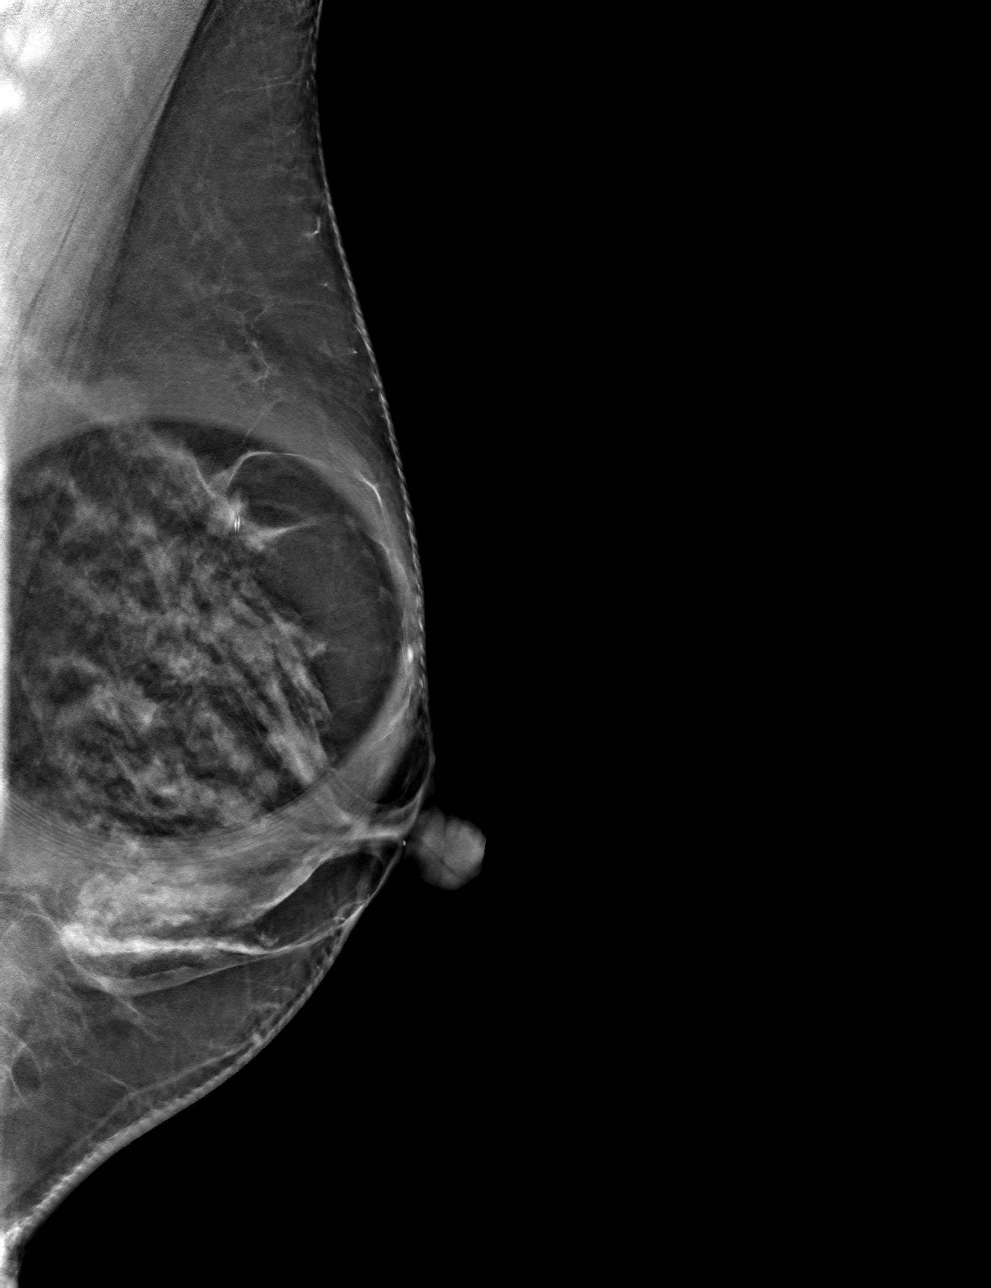

[L ML tomo · tomo slice 35/68.0]
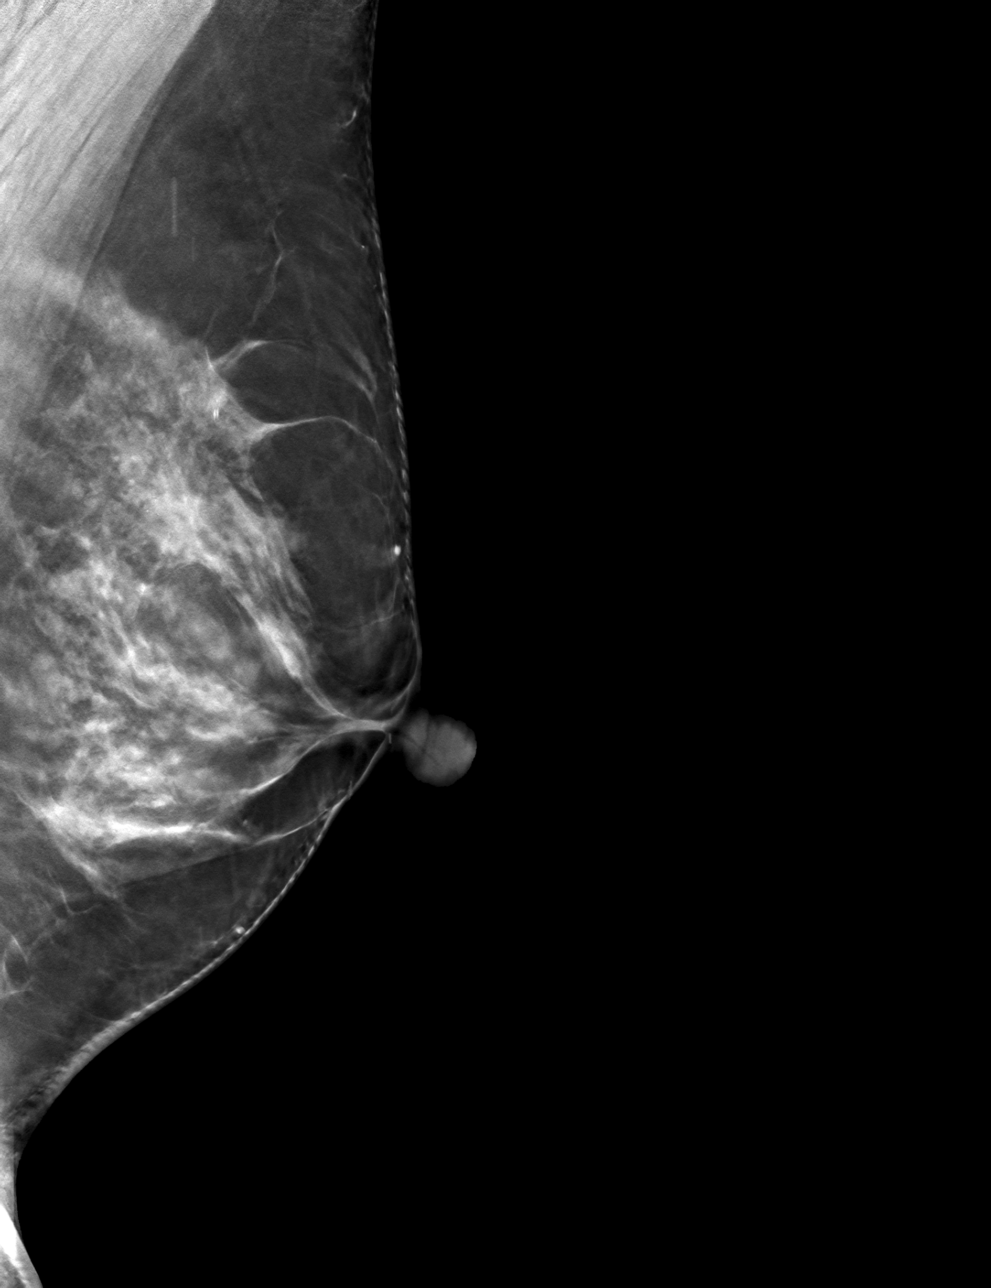

[4 of 12 positions shown; findings below may reference images not displayed]

ACR Breast Density Category c: The breast tissue is heterogeneously
dense, which may obscure small masses.
FINDINGS: 2D/3D full field and spot compression views of the LEFT breast
demonstrate no persistent abnormality at the site of the screening
study finding.
IMPRESSION: No persistent abnormality at the site of the screening study
finding.

RECOMMENDATION:
Bilateral screening mammogram in 1 year.

I have discussed the findings and recommendations with the patient.
If applicable, a reminder letter will be sent to the patient
regarding the next appointment.

BI-RADS CATEGORY  1: Negative.

## 2021-06-15 ENCOUNTER — Telehealth: Payer: Self-pay | Admitting: Family Medicine

## 2021-06-15 NOTE — Telephone Encounter (Signed)
error 

## 2021-07-09 ENCOUNTER — Encounter: Payer: BC Managed Care – PPO | Admitting: Family Medicine

## 2021-08-20 ENCOUNTER — Other Ambulatory Visit: Payer: Self-pay | Admitting: Family Medicine

## 2021-08-20 ENCOUNTER — Encounter: Payer: Self-pay | Admitting: Family Medicine

## 2021-08-25 ENCOUNTER — Ambulatory Visit
Admission: RE | Admit: 2021-08-25 | Discharge: 2021-08-25 | Disposition: A | Discharge status: Home / Self Care | Payer: BC Managed Care – PPO | Source: Ambulatory Visit | Attending: Family Medicine | Admitting: Family Medicine

## 2021-08-25 DIAGNOSIS — Z1231 Encounter for screening mammogram for malignant neoplasm of breast: Secondary | ICD-10-CM

## 2021-09-10 ENCOUNTER — Encounter: Payer: Self-pay | Admitting: Family Medicine

## 2021-09-10 ENCOUNTER — Ambulatory Visit (INDEPENDENT_AMBULATORY_CARE_PROVIDER_SITE_OTHER): Payer: BC Managed Care – PPO | Admitting: Family Medicine

## 2021-09-10 VITALS — BP 122/78 | HR 76 | Temp 97.2°F | Ht 66.0 in | Wt 172.2 lb

## 2021-09-10 DIAGNOSIS — D229 Melanocytic nevi, unspecified: Secondary | ICD-10-CM

## 2021-09-10 DIAGNOSIS — M19079 Primary osteoarthritis, unspecified ankle and foot: Secondary | ICD-10-CM

## 2021-09-10 DIAGNOSIS — Z808 Family history of malignant neoplasm of other organs or systems: Secondary | ICD-10-CM | POA: Diagnosis not present

## 2021-09-10 DIAGNOSIS — Z23 Encounter for immunization: Secondary | ICD-10-CM | POA: Diagnosis not present

## 2021-09-10 DIAGNOSIS — Z0000 Encounter for general adult medical examination without abnormal findings: Secondary | ICD-10-CM

## 2021-09-10 DIAGNOSIS — D259 Leiomyoma of uterus, unspecified: Secondary | ICD-10-CM | POA: Insufficient documentation

## 2021-09-10 LAB — CBC
HCT: 42.2 % (ref 35.0–45.0)
Hemoglobin: 13.6 g/dL (ref 11.7–15.5)
MCH: 26.8 pg — ABNORMAL LOW (ref 27.0–33.0)
MCHC: 32.2 g/dL (ref 32.0–36.0)
MCV: 83.1 fL (ref 80.0–100.0)
MPV: 10.6 fL (ref 7.5–12.5)
Platelets: 438 10*3/uL — ABNORMAL HIGH (ref 140–400)
RBC: 5.08 10*6/uL (ref 3.80–5.10)
RDW: 14.7 % (ref 11.0–15.0)
WBC: 7.8 10*3/uL (ref 3.8–10.8)

## 2021-09-10 LAB — SEDIMENTATION RATE: Sed Rate: 22 mm/h — ABNORMAL HIGH (ref 0–20)

## 2021-09-10 NOTE — Progress Notes (Signed)
? ?Established Patient Office Visit ? ?Subjective:  ?Patient ID: Melinda Mccormick, female    DOB: Apr 24, 1971  Age: 51 y.o. MRN: [id number] ? ?CC:  ?Chief Complaint  ?Patient presents with  ? Annual Exam  ?  CPE, concerns about weight gain unable to lose weight. Would like TSH drawn patient fasting.   ? ? ?HPI ?Melinda Mccormick presents for health check.  Has difficulty losing weight.  69 year old sister who has a history of thyroid cancer recently diagnosed with hormone receptor positive breast cancer. ? ?Past Medical History:  ?Diagnosis Date  ? Anemia   ? hx of  ? Arthritis   ? bilateral kneees L>R  ? GERD (gastroesophageal reflux disease)   ? on meds  ? Migraines   ? Tuberculosis 2009  ? tx with antibx  ( 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 per pt)  ? ? ?Past Surgical History:  ?Procedure Laterality Date  ? BREAST BIOPSY Right   ? BREAST BIOPSY Left   ? BREAST EXCISIONAL BIOPSY Left   ?  x 5  ? OVARIAN CYST SURGERY  10/2019  ? SALPINGECTOMY    ? ? ?Family History  ?Problem Relation Age of Onset  ? Arthritis Mother   ? Diabetes Mother   ? Hyperlipidemia Mother   ? Hypertension Mother   ? Miscarriages / [place] Mother   ? Arthritis Father   ? Stroke Father   ? Asthma Father   ? Hyperlipidemia Father   ? Miscarriages / Stillbirths Sister   ? Asthma Sister   ? Thyroid cancer Sister   ? Arthritis Maternal Grandmother   ? Heart attack Maternal Grandmother   ? Early death Maternal Grandfather   ? Heart attack Maternal Grandfather   ? Arthritis Paternal Grandmother   ? Heart attack Paternal Grandmother   ? Heart attack Paternal Grandfather   ? Asthma Sister   ? Leukemia Niece   ? Colon cancer Neg Hx   ? Esophageal cancer Neg Hx   ? Colon polyps Neg Hx   ? Rectal cancer Neg Hx   ? Stomach cancer Neg Hx   ? ? ?Social History  ? ?Socioeconomic History  ? Marital status: Significant Other  ?  Spouse name: Not on file  ? Number of children: Not on file  ? Years of education: Not on file  ? Highest education level: Not on file   ?Occupational History  ? Not on file  ?Tobacco Use  ? Smoking status: Former  ? Smokeless tobacco: Never  ? Tobacco comments:  ?  smoke 2 years in college. said 2 cigarettes per day. Over 25 years ago  ?Vaping Use  ? Vaping Use: Never used  ?Substance and Sexual Activity  ? Alcohol use: Yes  ?  Alcohol/week: 2.0 standard drinks  ?  Types: 2 Glasses of wine per week  ?  Comment: 1-2 times per month  ? Drug use: Never  ? Sexual activity: Not on file  ?Other Topics Concern  ? Not on file  ?Social History Narrative  ? Not on file  ? ?Social Determinants of Health  ? ?Financial Resource Strain: Not on file  ?Food Insecurity: Not on file  ?Transportation Needs: Not on file  ?Physical Activity: Not on file  ?Stress: Not on file  ?Social Connections: Not on file  ?Intimate Partner Violence: Not on file  ? ? ?Outpatient Medications Prior to Visit  ?Medication Sig Dispense Refill  ? Ascorbic Acid (VITAMIN C) 1000 MG tablet Take 1,000 mg  by mouth daily.    ? Biotin w/ Vitamins C & E (HAIR/SKIN/NAILS PO) Take 2 tablets by mouth daily.    ? glucosamine-chondroitin 500-400 MG tablet Take 1 tablet by mouth 2 (two) times daily.    ? omega-3 acid ethyl esters (LOVAZA) 1 g capsule Take by mouth 2 (two) times daily.    ? pantoprazole (PROTONIX) 40 MG tablet Take 1 tablet by mouth once daily (Patient taking differently: every other day.) 30 tablet 5  ? terbinafine (LAMISIL AT) 1 % cream Apply 1 application topically 2 (two) times daily. 30 g 0  ? amoxicillin-clavulanate (AUGMENTIN) 875-125 MG tablet Take 1 tablet by mouth 2 (two) times daily. 20 tablet 0  ? Pseudoephedrine-APAP-DM (DAYQUIL MULTI-SYMPTOM COLD/FLU PO) Take by mouth.    ? ?No facility-administered medications prior to visit.  ? ? ?No Known Allergies ? ?ROS ?Review of Systems  ?Constitutional:  Negative for diaphoresis, fatigue, fever and unexpected weight change.  ?HENT: Negative.    ?Eyes:  Negative for photophobia and visual disturbance.  ?Respiratory: Negative.     ?Cardiovascular: Negative.   ?Gastrointestinal: Negative.   ?Endocrine: Negative for polyphagia and polyuria.  ?Genitourinary: Negative.   ?Musculoskeletal:  Negative for gait problem and joint swelling.  ?Neurological:  Negative for speech difficulty, weakness and numbness.  ?Psychiatric/Behavioral: Negative.    ? ?  ?Objective:  ?  ?Physical Exam ?Vitals and nursing note reviewed.  ?Constitutional:   ?   General: She is not in acute distress. ?   Appearance: Normal appearance. She is not ill-appearing, toxic-appearing or diaphoretic.  ?HENT:  ?   Head: Normocephalic and atraumatic.  ?   Right Ear: Tympanic membrane, ear canal and external ear normal.  ?   Left Ear: Tympanic membrane, ear canal and external ear normal.  ?   Mouth/Throat:  ?   Mouth: Mucous membranes are moist.  ?   Pharynx: Oropharynx is clear. No oropharyngeal exudate or posterior oropharyngeal erythema.  ?Eyes:  ?   General: No scleral icterus.    ?   Right eye: No discharge.     ?   Left eye: No discharge.  ?   Extraocular Movements: Extraocular movements intact.  ?   Conjunctiva/sclera: Conjunctivae normal.  ?   Pupils: Pupils are equal, round, and reactive to light.  ?Neck:  ?   Thyroid: No thyroid mass, thyromegaly or thyroid tenderness.  ?Cardiovascular:  ?   Rate and Rhythm: Normal rate and regular rhythm.  ?Pulmonary:  ?   Effort: Pulmonary effort is normal.  ?   Breath sounds: Normal breath sounds.  ?Abdominal:  ?   General: Bowel sounds are normal.  ?Musculoskeletal:  ?   Cervical back: No rigidity or tenderness.  ?   Right lower leg: No edema.  ?   Left lower leg: No edema.  ?     Legs: ? ?Lymphadenopathy:  ?   Cervical: No cervical adenopathy.  ?Skin: ?   General: Skin is warm and dry.  ? ?    ?Neurological:  ?   Mental Status: She is alert and oriented to person, place, and time.  ?Psychiatric:     ?   Mood and Affect: Mood normal.     ?   Behavior: Behavior normal.  ? ? ?BP 122/78 (BP Location: Right Arm, Patient Position: Sitting,  Cuff Size: Normal)   Pulse 76   Temp (!) 97.2 ?F (36.2 ?C) (Temporal)   Ht '5\' 6"'$  (1.676 m)   Wt 172  lb 3.2 oz (78.1 kg)   SpO2 97%   BMI 27.79 kg/m?  ?Wt Readings from Last 3 Encounters:  ?09/10/21 172 lb 3.2 oz (78.1 kg)  ?11/14/20 171 lb (77.6 kg)  ?10/30/20 171 lb (77.6 kg)  ? ? ? ?Health Maintenance Due  ?Topic Date Due  ? HIV Screening  Never done  ? COVID-19 Vaccine (4 - Booster for Pfizer series) 07/04/2020  ? Zoster Vaccines- Shingrix (1 of 2) Never done  ? ? ?There are no preventive care reminders to display for this patient. ? ?Lab Results  ?Component Value Date  ? TSH 2.71 08/31/2019  ? ?Lab Results  ?Component Value Date  ? WBC 7.7 09/04/2020  ? HGB 12.6 09/04/2020  ? HCT 38.5 09/04/2020  ? MCV 81.3 09/04/2020  ? PLT 388.0 09/04/2020  ? ?Lab Results  ?Component Value Date  ? NA 139 09/04/2020  ? K 4.2 09/04/2020  ? CO2 30 09/04/2020  ? GLUCOSE 95 09/04/2020  ? BUN 15 09/04/2020  ? CREATININE 0.85 09/04/2020  ? BILITOT 0.5 09/04/2020  ? ALKPHOS 105 09/04/2020  ? AST 19 09/04/2020  ? ALT 34 09/04/2020  ? PROT 7.4 09/04/2020  ? ALBUMIN 4.6 09/04/2020  ? CALCIUM 9.8 09/04/2020  ? GFR 80.53 09/04/2020  ? ?Lab Results  ?Component Value Date  ? CHOL 254 (H) 09/04/2020  ? ?Lab Results  ?Component Value Date  ? HDL 69.80 09/04/2020  ? ?Lab Results  ?Component Value Date  ? LDLCALC 161 (H) 09/04/2020  ? ?Lab Results  ?Component Value Date  ? TRIG 116.0 09/04/2020  ? ?Lab Results  ?Component Value Date  ? CHOLHDL 4 09/04/2020  ? ?Lab Results  ?Component Value Date  ? HGBA1C 5.8 09/04/2020  ? ? ?  ?Assessment & Plan:  ? ?Problem List Items Addressed This Visit   ? ?  ? Musculoskeletal and Integument  ? Arthritis of first MTP joint  ? Relevant Orders  ? Uric acid  ? Rheumatoid Factor  ? Sedimentation rate  ?  ? Other  ? Healthcare maintenance  ? Relevant Orders  ? CBC  ? Comprehensive metabolic panel  ? Hemoglobin A1c  ? Lipid panel  ? Urinalysis, Routine w reflex microscopic  ? Family history of thyroid cancer   ? Relevant Orders  ? Ambulatory referral to Endocrinology  ? T3, free  ? TSH  ? Atypical mole  ? Relevant Orders  ? Ambulatory referral to Dermatology  ? Need for shingles vaccine - Primary  ? Relevant Orders  ? Varicella

## 2021-09-11 LAB — COMPREHENSIVE METABOLIC PANEL
AG Ratio: 1.5 (calc) (ref 1.0–2.5)
ALT: 29 U/L (ref 6–29)
AST: 19 U/L (ref 10–35)
Albumin: 5 g/dL (ref 3.6–5.1)
Alkaline phosphatase (APISO): 94 U/L (ref 37–153)
BUN: 9 mg/dL (ref 7–25)
CO2: 26 mmol/L (ref 20–32)
Calcium: 10.3 mg/dL (ref 8.6–10.4)
Chloride: 102 mmol/L (ref 98–110)
Creat: 0.75 mg/dL (ref 0.50–1.03)
Globulin: 3.3 g/dL (calc) (ref 1.9–3.7)
Glucose, Bld: 80 mg/dL (ref 65–99)
Potassium: 4.1 mmol/L (ref 3.5–5.3)
Sodium: 140 mmol/L (ref 135–146)
Total Bilirubin: 0.6 mg/dL (ref 0.2–1.2)
Total Protein: 8.3 g/dL — ABNORMAL HIGH (ref 6.1–8.1)

## 2021-09-11 LAB — URINALYSIS, ROUTINE W REFLEX MICROSCOPIC
Bilirubin Urine: NEGATIVE
Glucose, UA: NEGATIVE
Hgb urine dipstick: NEGATIVE
Hyaline Cast: NONE SEEN /LPF
Ketones, ur: NEGATIVE
Nitrite: NEGATIVE
Protein, ur: NEGATIVE
RBC / HPF: NONE SEEN /HPF (ref 0–2)
Specific Gravity, Urine: 1.011 (ref 1.001–1.035)
pH: 5.5 (ref 5.0–8.0)

## 2021-09-11 LAB — HEMOGLOBIN A1C
Hgb A1c MFr Bld: 5.6 % of total Hgb (ref <5.7)
Mean Plasma Glucose: 114 mg/dL
eAG (mmol/L): 6.3 mmol/L

## 2021-09-11 LAB — LIPID PANEL
Cholesterol: 271 mg/dL — ABNORMAL HIGH (ref <200)
HDL: 71 mg/dL (ref >=50)
LDL Cholesterol (Calc): 171 mg/dL (calc) — ABNORMAL HIGH
Non-HDL Cholesterol (Calc): 200 mg/dL (calc) — ABNORMAL HIGH (ref <130)
Total CHOL/HDL Ratio: 3.8 (calc) (ref <5.0)
Triglycerides: 143 mg/dL (ref <150)

## 2021-09-11 LAB — TSH: TSH: 1.27 mIU/L

## 2021-09-11 LAB — MICROSCOPIC MESSAGE

## 2021-09-11 LAB — T3, FREE: T3, Free: 3.6 pg/mL (ref 2.3–4.2)

## 2021-09-11 LAB — RHEUMATOID FACTOR: Rheumatoid fact SerPl-aCnc: <14 IU/mL (ref <14)

## 2021-09-11 LAB — URIC ACID: Uric Acid, Serum: 6.1 mg/dL (ref 2.5–7.0)

## 2021-09-15 ENCOUNTER — Encounter: Payer: Self-pay | Admitting: Family Medicine

## 2021-09-20 ENCOUNTER — Other Ambulatory Visit: Payer: Self-pay | Admitting: Family Medicine

## 2021-09-20 DIAGNOSIS — K219 Gastro-esophageal reflux disease without esophagitis: Secondary | ICD-10-CM

## 2021-12-14 ENCOUNTER — Other Ambulatory Visit: Payer: Self-pay

## 2021-12-14 DIAGNOSIS — K219 Gastro-esophageal reflux disease without esophagitis: Secondary | ICD-10-CM

## 2021-12-14 MED ORDER — PANTOPRAZOLE SODIUM 40 MG PO TBEC: 40.0000 mg | DELAYED_RELEASE_TABLET | Freq: Every day | ORAL | 3 refills | Status: DC

## 2022-01-03 ENCOUNTER — Encounter: Payer: Self-pay | Admitting: Family Medicine

## 2022-03-26 ENCOUNTER — Emergency Department (HOSPITAL_BASED_OUTPATIENT_CLINIC_OR_DEPARTMENT_OTHER)
Admission: EM | Admit: 2022-03-26 | Discharge: 2022-03-26 | Disposition: A | Discharge status: Home / Self Care | Payer: BC Managed Care – PPO | Attending: Emergency Medicine | Admitting: Emergency Medicine

## 2022-03-26 ENCOUNTER — Encounter (HOSPITAL_BASED_OUTPATIENT_CLINIC_OR_DEPARTMENT_OTHER): Payer: Self-pay | Admitting: Emergency Medicine

## 2022-03-26 ENCOUNTER — Other Ambulatory Visit: Payer: Self-pay

## 2022-03-26 ENCOUNTER — Other Ambulatory Visit (HOSPITAL_BASED_OUTPATIENT_CLINIC_OR_DEPARTMENT_OTHER): Payer: Self-pay

## 2022-03-26 ENCOUNTER — Emergency Department (HOSPITAL_BASED_OUTPATIENT_CLINIC_OR_DEPARTMENT_OTHER): Payer: BC Managed Care – PPO

## 2022-03-26 DIAGNOSIS — R1011 Right upper quadrant pain: Secondary | ICD-10-CM | POA: Diagnosis not present

## 2022-03-26 DIAGNOSIS — R1013 Epigastric pain: Secondary | ICD-10-CM | POA: Diagnosis present

## 2022-03-26 DIAGNOSIS — R110 Nausea: Secondary | ICD-10-CM | POA: Insufficient documentation

## 2022-03-26 LAB — CBC WITH DIFFERENTIAL/PLATELET
Abs Immature Granulocytes: 0.03 10*3/uL (ref 0.00–0.07)
Basophils Absolute: 0 10*3/uL (ref 0.0–0.1)
Basophils Relative: 0 %
Eosinophils Absolute: 0 10*3/uL (ref 0.0–0.5)
Eosinophils Relative: 0 %
HCT: 40.5 % (ref 36.0–46.0)
Hemoglobin: 13.1 g/dL (ref 12.0–15.0)
Immature Granulocytes: 0 %
Lymphocytes Relative: 14 %
Lymphs Abs: 1.4 10*3/uL (ref 0.7–4.0)
MCH: 26.9 pg (ref 26.0–34.0)
MCHC: 32.3 g/dL (ref 30.0–36.0)
MCV: 83.2 fL (ref 80.0–100.0)
Monocytes Absolute: 0.3 10*3/uL (ref 0.1–1.0)
Monocytes Relative: 3 %
Neutro Abs: 8.7 10*3/uL — ABNORMAL HIGH (ref 1.7–7.7)
Neutrophils Relative %: 83 %
Platelets: 434 10*3/uL — ABNORMAL HIGH (ref 150–400)
RBC: 4.87 MIL/uL (ref 3.87–5.11)
RDW: 15 % (ref 11.5–15.5)
WBC: 10.4 10*3/uL (ref 4.0–10.5)
nRBC: 0 % (ref 0.0–0.2)

## 2022-03-26 LAB — COMPREHENSIVE METABOLIC PANEL
ALT: 78 U/L — ABNORMAL HIGH (ref 0–44)
AST: 37 U/L (ref 15–41)
Albumin: 4.4 g/dL (ref 3.5–5.0)
Alkaline Phosphatase: 166 U/L — ABNORMAL HIGH (ref 38–126)
Anion gap: 9 (ref 5–15)
BUN: 13 mg/dL (ref 6–20)
CO2: 24 mmol/L (ref 22–32)
Calcium: 9.2 mg/dL (ref 8.9–10.3)
Chloride: 104 mmol/L (ref 98–111)
Creatinine, Ser: 0.66 mg/dL (ref 0.44–1.00)
GFR, Estimated: >60 mL/min (ref >60)
Glucose, Bld: 137 mg/dL — ABNORMAL HIGH (ref 70–99)
Potassium: 3.5 mmol/L (ref 3.5–5.1)
Sodium: 137 mmol/L (ref 135–145)
Total Bilirubin: 0.5 mg/dL (ref 0.3–1.2)
Total Protein: 8.2 g/dL — ABNORMAL HIGH (ref 6.5–8.1)

## 2022-03-26 LAB — LIPASE, BLOOD: Lipase: 31 U/L (ref 11–51)

## 2022-03-26 LAB — TROPONIN I (HIGH SENSITIVITY): Troponin I (High Sensitivity): 3 ng/L (ref <18)

## 2022-03-26 MED ORDER — HYDROMORPHONE HCL 1 MG/ML IJ SOLN
1.0000 mg | Freq: Once | INTRAMUSCULAR | Status: AC
  Administered 2022-03-26: 1 mg via INTRAVENOUS
  Filled 2022-03-26: qty 1

## 2022-03-26 MED ORDER — ONDANSETRON HCL 4 MG/2ML IJ SOLN
4.0000 mg | Freq: Once | INTRAMUSCULAR | Status: AC
  Administered 2022-03-26: 4 mg via INTRAVENOUS
  Filled 2022-03-26: qty 2

## 2022-03-26 MED ORDER — IOHEXOL 350 MG/ML SOLN
100.0000 mL | Freq: Once | INTRAVENOUS | Status: AC | PRN
  Administered 2022-03-26: 100 mL via INTRAVENOUS

## 2022-03-26 MED ORDER — ONDANSETRON HCL 4 MG PO TABS
4.0000 mg | ORAL_TABLET | Freq: Four times a day (QID) | ORAL | 0 refills | Status: DC
  Filled 2022-03-26: qty 12, 5d supply, fill #0

## 2022-03-26 MED ORDER — PANTOPRAZOLE SODIUM 20 MG PO TBEC
20.0000 mg | DELAYED_RELEASE_TABLET | Freq: Every day | ORAL | 0 refills | Status: DC
  Filled 2022-03-26: qty 14, 14d supply, fill #0

## 2022-03-26 MED ORDER — SODIUM CHLORIDE 0.9 % IV BOLUS
1000.0000 mL | Freq: Once | INTRAVENOUS | Status: AC
  Administered 2022-03-26: 1000 mL via INTRAVENOUS

## 2022-03-26 NOTE — ED Notes (Signed)
Patient refused EKG.

## 2022-03-26 NOTE — ED Triage Notes (Signed)
Pt arrives pov, slow gait, c/o epigastric pain radiating to back with n/v since 0200 today. Endorses diarrhea x 2 days pta. Recent travel to [place], ret on 10/17. Referred by at [facility], refused EKG, states was done at [facility]

## 2022-03-26 NOTE — ED Notes (Signed)
Reports vomiting. Reports abdominal pain states pain is in her chest and that it radiates to her back

## 2022-03-26 NOTE — ED Provider Notes (Signed)
[facility] EMERGENCY DEPARTMENT Provider Note   CSN: [id number] Arrival date & time: 03/26/22  0920     History  Chief Complaint  Patient presents with   Abdominal Pain    Melinda Mccormick is a 51 y.o. female.  The history is provided by the patient.  Abdominal Pain Pain location:  Epigastric Pain quality: aching   Pain radiates to:  Back Pain severity:  Severe Onset quality:  Sudden Duration:  8 hours Timing:  Constant Progression:  Worsening Chronicity:  New Context: not previous surgeries   Relieved by:  Nothing Worsened by:  Nothing Associated symptoms: diarrhea (?) and nausea   Associated symptoms: no anorexia, no belching, no chest pain, no chills, no constipation, no cough, no dysuria, no fatigue, no fever, no melena and no shortness of breath   Risk factors: has not had multiple surgeries        Home Medications Prior to Admission medications   Medication Sig Start Date End Date Taking? Authorizing Provider  ondansetron (ZOFRAN) 4 MG tablet Take 1 tablet (4 mg total) by mouth every 6 (six) hours. 03/26/22  Yes Modene Andy, DO  pantoprazole (PROTONIX) 20 MG tablet Take 1 tablet (20 mg total) by mouth daily for 14 days. 03/26/22 04/09/22 Yes Kriya Westra, DO  Ascorbic Acid (VITAMIN C) 1000 MG tablet Take 1,000 mg by mouth daily.    [provider]  Biotin w/ Vitamins C & E (HAIR/SKIN/NAILS PO) Take 2 tablets by mouth daily.    [provider]  glucosamine-chondroitin 500-400 MG tablet Take 1 tablet by mouth 2 (two) times daily.    [provider]  omega-3 acid ethyl esters (LOVAZA) 1 g capsule Take by mouth 2 (two) times daily.    [provider]  terbinafine (LAMISIL AT) 1 % cream Apply 1 application topically 2 (two) times daily. 02/20/21   Kennyth Arnold, FNP      Allergies    Patient has no known allergies.    Review of Systems   Review of Systems  Constitutional:  Negative for chills, fatigue and  fever.  Respiratory:  Negative for cough and shortness of breath.   Cardiovascular:  Negative for chest pain.  Gastrointestinal:  Positive for abdominal pain, diarrhea (?) and nausea. Negative for anorexia, constipation and melena.  Genitourinary:  Negative for dysuria.    Physical Exam Updated Vital Signs BP 122/87   Pulse 89   Temp 98.4 F (36.9 C)   Resp 14   Ht '5\' 6"'$  (1.676 m)   Wt 72.6 kg   LMP 07/08/2020   SpO2 96%   BMI 25.82 kg/m  Physical Exam Vitals and nursing note reviewed.  Constitutional:      General: She is in acute distress.     Appearance: She is well-developed. She is ill-appearing.  HENT:     Head: Normocephalic and atraumatic.  Eyes:     Extraocular Movements: Extraocular movements intact.     Conjunctiva/sclera: Conjunctivae normal.  Cardiovascular:     Rate and Rhythm: Normal rate and regular rhythm.     Heart sounds: No murmur heard. Pulmonary:     Effort: Pulmonary effort is normal. No respiratory distress.     Breath sounds: Normal breath sounds.  Abdominal:     Palpations: Abdomen is soft.     Tenderness: There is abdominal tenderness in the right upper quadrant and epigastric area.     Hernia: No hernia is present.  Musculoskeletal:  General: No swelling.     Cervical back: Neck supple.  Skin:    General: Skin is warm and dry.     Capillary Refill: Capillary refill takes less than 2 seconds.  Neurological:     Mental Status: She is alert.  Psychiatric:        Mood and Affect: Mood normal.     ED Results / Procedures / Treatments   Labs (all labs ordered are listed, but only abnormal results are displayed) Labs Reviewed  CBC WITH DIFFERENTIAL/PLATELET - Abnormal; Notable for the following components:      Result Value   Platelets 434 (*)    Neutro Abs 8.7 (*)    All other components within normal limits  COMPREHENSIVE METABOLIC PANEL - Abnormal; Notable for the following components:   Glucose, Bld 137 (*)    Total  Protein 8.2 (*)    ALT 78 (*)    Alkaline Phosphatase 166 (*)    All other components within normal limits  LIPASE, BLOOD  TROPONIN I (HIGH SENSITIVITY)    EKG EKG Interpretation  Date/Time:  Friday March 26 2022 09:42:49 EDT Ventricular Rate:  90 PR Interval:  149 QRS Duration: 78 QT Interval:  367 QTC Calculation: 449 R Axis:   79 Text Interpretation: Sinus rhythm Consider left atrial enlargement Confirmed by Lennice Sites ([id number]) on 03/26/2022 9:44:44 AM  Radiology CT Angio Chest/Abd/Pel for Dissection W and/or Wo Contrast  Result Date: 03/26/2022 CLINICAL DATA:  Acute aortic syndrome suspected. Patient sent from urgent care. Abdominal pain radiating into the back since early this morning. EXAM: CT ANGIOGRAPHY CHEST, ABDOMEN AND PELVIS TECHNIQUE: Non-contrast CT of the chest was initially obtained. Multidetector CT imaging through the chest, abdomen and pelvis was performed using the standard protocol during bolus administration of intravenous contrast. Multiplanar reconstructed images and MIPs were obtained and reviewed to evaluate the vascular anatomy. RADIATION DOSE REDUCTION: This exam was performed according to the departmental dose-optimization program which includes automated exposure control, adjustment of the mA and/or kV according to patient size and/or use of iterative reconstruction technique. CONTRAST:  178m OMNIPAQUE IOHEXOL 350 MG/ML SOLN COMPARISON:  None Available. FINDINGS: CTA CHEST FINDINGS Cardiovascular: Satisfactory opacification of the pulmonary arteries to the segmental level. No evidence of pulmonary embolism. Normal heart size. No pericardial effusion. Mediastinum/Nodes: No enlarged mediastinal, hilar, or axillary lymph nodes. Thyroid gland, trachea, and esophagus demonstrate no significant findings. Lungs/Pleura: Lungs are clear. No pleural effusion or pneumothorax. Musculoskeletal: No chest wall abnormality. No acute or significant osseous findings. Review of  the MIP images confirms the above findings. CTA ABDOMEN AND PELVIS FINDINGS VASCULAR Aorta: Normal caliber aorta without aneurysm, dissection, vasculitis or significant stenosis. Celiac: Patent without evidence of aneurysm, dissection, vasculitis or significant stenosis. SMA: Patent without evidence of aneurysm, dissection, vasculitis or significant stenosis. Renals: Both renal arteries are patent without evidence of aneurysm, dissection, vasculitis, fibromuscular dysplasia or significant stenosis. IMA: Patent without evidence of aneurysm, dissection, vasculitis or significant stenosis. Inflow: Patent without evidence of aneurysm, dissection, vasculitis or significant stenosis. Veins: No obvious venous abnormality within the limitations of this arterial phase study. Review of the MIP images confirms the above findings. NON-VASCULAR Hepatobiliary: No focal liver abnormality is seen. No gallstones, gallbladder wall thickening, or biliary dilatation. Pancreas: Unremarkable. No pancreatic ductal dilatation or surrounding inflammatory changes. Spleen: Normal in size without focal abnormality. Adrenals/Urinary Tract: Adrenal glands are unremarkable. Kidneys are normal, without renal calculi, focal lesion, or hydronephrosis. Bladder is unremarkable. Stomach/Bowel: Stomach is within normal  limits. Appendix appears normal. No evidence of bowel wall thickening, distention, or inflammatory changes. Lymphatic: No lymphadenopathy. Reproductive: Lobulated heterogeneous fibroid uterus. No adnexal mass. Other: No abdominal wall hernia or abnormality. No abdominopelvic ascites. Musculoskeletal: No acute or significant osseous findings. Review of the MIP images confirms the above findings. IMPRESSION: 1. No evidence of acute pulmonary embolism, aortic dissection or aneurysm. 2. Lungs are clear. 3. No CT evidence of acute abdominal/pelvic process. 4. Fibroid uterus. Electronically Signed   By: Keane Police D.O.   On: 03/26/2022 11:34     Procedures Procedures    Medications Ordered in ED Medications  HYDROmorphone (DILAUDID) injection 1 mg (1 mg Intravenous Given 03/26/22 1011)  ondansetron (ZOFRAN) injection 4 mg (4 mg Intravenous Given 03/26/22 1018)  sodium chloride 0.9 % bolus 1,000 mL (1,000 mLs Intravenous New Bag/Given 03/26/22 1012)  iohexol (OMNIPAQUE) 350 MG/ML injection 100 mL (100 mLs Intravenous Contrast Given 03/26/22 1051)    ED Course/ Medical Decision Making/ A&P                           Medical Decision Making Amount and/or Complexity of Data Reviewed Labs: ordered. Radiology: ordered.  Risk Prescription drug management.   Melinda Mccormick is here with abdominal pain.  Normal vitals.  No fever.  Comorbidities include acid reflux.  Patient here with severe epigastric abdominal pain that radiates into her back.  Differential diagnosis includes pancreatitis versus cholecystitis versus gastritis versus perforation versus dissection.  EKG shows sinus rhythm, no ischemic changes.  Seems less likely to be ACS or PE.  To evaluate CBC, CMP, lipase, CT dissection study ordered.  IV fluids IV antiemetics and IV pain medicine given.  I reviewed and interpreted EKG.  Per my review and interpretation labs is no significant anemia, electrolyte abnormality, kidney injury.  Lipase normal.  Gallbladder enzymes normal.  CT dissection study per radiology report normal.  No dissection or cholecystitis or pancreatitis or perforation.  Troponin is normal.  Pain completely resolved on reevaluation.  My suspicion is that this is likely acid reflux related to stomach related.  She has been having some nausea vomiting diarrhea and could be a viral process.  Will prescribe Zofran and Protonix.  We will have her follow-up with primary care doctor.  Discharged in good condition.  This chart was dictated using voice recognition software.  Despite best efforts to proofread,  errors can occur which can change the documentation  meaning.         Final Clinical Impression(s) / ED Diagnoses Final diagnoses:  Epigastric pain    Rx / DC Orders ED Discharge Orders          Ordered    ondansetron (ZOFRAN) 4 MG tablet  Every 6 hours        03/26/22 1222    pantoprazole (PROTONIX) 20 MG tablet  Daily        03/26/22 [place], Quita Skye, DO 03/26/22 1224

## 2022-03-26 NOTE — ED Notes (Signed)
Patient refused ekg states she got one at urgent care

## 2022-03-30 ENCOUNTER — Encounter: Payer: Self-pay | Admitting: Family Medicine

## 2022-08-03 ENCOUNTER — Other Ambulatory Visit: Payer: Self-pay | Admitting: Family Medicine

## 2022-08-03 DIAGNOSIS — Z1231 Encounter for screening mammogram for malignant neoplasm of breast: Secondary | ICD-10-CM

## 2022-08-16 ENCOUNTER — Encounter: Payer: Self-pay | Admitting: Family Medicine

## 2022-08-16 ENCOUNTER — Ambulatory Visit: Payer: 59 | Admitting: Family Medicine

## 2022-08-16 VITALS — BP 132/84 | HR 77 | Temp 98.5°F | Ht 66.0 in | Wt 168.4 lb

## 2022-08-16 DIAGNOSIS — R519 Headache, unspecified: Secondary | ICD-10-CM | POA: Diagnosis not present

## 2022-08-16 DIAGNOSIS — S0300XA Dislocation of jaw, unspecified side, initial encounter: Secondary | ICD-10-CM | POA: Diagnosis not present

## 2022-08-16 MED ORDER — CYCLOBENZAPRINE HCL 10 MG PO TABS: 10.0000 mg | ORAL_TABLET | Freq: Every day | ORAL | 0 refills | Status: DC

## 2022-08-16 MED ORDER — PREDNISONE 10 MG PO TABS: 10.0000 mg | ORAL_TABLET | Freq: Two times a day (BID) | ORAL | 0 refills | Status: AC

## 2022-08-16 NOTE — Progress Notes (Signed)
Established Patient Office Visit   Subjective:  Patient ID: Melinda Mccormick, female    DOB: 1970-07-07  Age: 52 y.o. MRN: [id number]  Chief Complaint  Patient presents with   Left side pain    Since last Wednesday, pain intensify when bending head down Dentist did scan, but nothing was found  Radiating from the face to the back of the head      HPI Encounter Diagnoses  Name Primary?   Facial pain Yes   Dislocation of temporomandibular joint, initial encounter    Presents with a 2-week history of pain seems to start in her left lower jaw area moved up to the upper jaw.  There is associated pain in the zygomatic arch with radiation of pain towards her left ear and up the side of her head.  She saw her dentist who checked each one of her teeth separately for hot and cold sensitivity.  All were negative plain x-rays and a panoramic view were all negative.  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  She denies sinus symptoms.  There have been no fevers chills or weight loss.   Review of Systems  Constitutional: Negative.   HENT: Negative.    Eyes:  Negative for blurred vision, discharge and redness.  Respiratory: Negative.    Cardiovascular: Negative.   Gastrointestinal:  Negative for abdominal pain.  Genitourinary: Negative.   Musculoskeletal: Negative.  Negative for myalgias.  Skin:  Negative for rash.  Neurological:  Negative for tingling, loss of consciousness and weakness.  Endo/Heme/Allergies:  Negative for polydipsia.     Current Outpatient Medications:    Ascorbic Acid (VITAMIN C) 1000 MG tablet, Take 1,000 mg by mouth daily., Disp: , Rfl:    Biotin w/ Vitamins C & E (HAIR/SKIN/NAILS PO), Take 2 tablets by mouth daily., Disp: , Rfl:    cyclobenzaprine (FLEXERIL) 10 MG tablet, Take 1 tablet (10 mg total) by mouth at bedtime., Disp: 30 tablet, Rfl: 0   glucosamine-chondroitin 500-400 MG tablet, Take 1 tablet by mouth 2 (two) times  daily., Disp: , Rfl:    omega-3 acid ethyl esters (LOVAZA) 1 g capsule, Take by mouth 2 (two) times daily., Disp: , Rfl:    predniSONE (DELTASONE) 10 MG tablet, Take 1 tablet (10 mg total) by mouth 2 (two) times daily with a meal for 7 days., Disp: 14 tablet, Rfl: 0   terbinafine (LAMISIL AT) 1 % cream, Apply 1 application topically 2 (two) times daily., Disp: 30 g, Rfl: 0   pantoprazole (PROTONIX) 20 MG tablet, Take 1 tablet (20 mg total) by mouth daily for 14 days., Disp: 14 tablet, Rfl: 0   Objective:     BP 132/84   Pulse 77   Temp 98.5 F (36.9 C) (Oral)   Ht '5\' 6"'$  (1.676 m)   Wt 168 lb 6 oz (76.4 kg)   LMP 07/08/2020   SpO2 97%   BMI 27.18 kg/m    Physical Exam Constitutional:      General: She is not in acute distress.    Appearance: Normal appearance. She is not ill-appearing, toxic-appearing or diaphoretic.  HENT:     Head: Normocephalic and atraumatic.      Right Ear: External ear normal.     Left Ear: External ear normal.     Mouth/Throat:     Mouth: Mucous membranes are moist.     Pharynx: Oropharynx is clear. No oropharyngeal exudate or  posterior oropharyngeal erythema.  Eyes:     General: No scleral icterus.       Right eye: No discharge.        Left eye: No discharge.     Extraocular Movements: Extraocular movements intact.     Conjunctiva/sclera: Conjunctivae normal.  Cardiovascular:     Rate and Rhythm: Normal rate and regular rhythm.  Pulmonary:     Effort: Pulmonary effort is normal. No respiratory distress.  Musculoskeletal:     Cervical back: No rigidity or tenderness.  Lymphadenopathy:     Cervical: No cervical adenopathy.  Skin:    General: Skin is warm and dry.  Neurological:     Mental Status: She is alert and oriented to person, place, and time.  Psychiatric:        Mood and Affect: Mood normal.        Behavior: Behavior normal.      No results found for any visits on 08/16/22.    The 10-year ASCVD risk score (Arnett DK, et al.,  2019) is: 1.6%    Assessment & Plan:   Facial pain -     Sedimentation rate -     C-reactive protein -     CBC -     CT HEAD W CONTRAST (5MM); Future -     CT HEAD WO CONTRAST (5MM); Future -     predniSONE; Take 1 tablet (10 mg total) by mouth 2 (two) times daily with a meal for 7 days.  Dispense: 14 tablet; Refill: 0 -     Comprehensive metabolic panel -     Multiple Myeloma Panel (SPEP&IFE w/QIG)  Dislocation of temporomandibular joint, initial encounter -     predniSONE; Take 1 tablet (10 mg total) by mouth 2 (two) times daily with a meal for 7 days.  Dispense: 14 tablet; Refill: 0 -     Cyclobenzaprine HCl; Take 1 tablet (10 mg total) by mouth at bedtime.  Dispense: 30 tablet; Refill: 0    Return in about 2 weeks (around 08/30/2022).    Libby Maw, MD

## 2022-08-17 LAB — COMPREHENSIVE METABOLIC PANEL
ALT: 115 U/L — ABNORMAL HIGH (ref 0–35)
AST: 59 U/L — ABNORMAL HIGH (ref 0–37)
Albumin: 4.3 g/dL (ref 3.5–5.2)
Alkaline Phosphatase: 143 U/L — ABNORMAL HIGH (ref 39–117)
BUN: 10 mg/dL (ref 6–23)
CO2: 29 mEq/L (ref 19–32)
Calcium: 10 mg/dL (ref 8.4–10.5)
Chloride: 102 mEq/L (ref 96–112)
Creatinine, Ser: 0.77 mg/dL (ref 0.40–1.20)
GFR: 89.44 mL/min (ref >60.00)
Glucose, Bld: 89 mg/dL (ref 70–99)
Potassium: 4.2 mEq/L (ref 3.5–5.1)
Sodium: 140 mEq/L (ref 135–145)
Total Bilirubin: 0.3 mg/dL (ref 0.2–1.2)
Total Protein: 7.1 g/dL (ref 6.0–8.3)

## 2022-08-17 LAB — CBC
HCT: 38.7 % (ref 36.0–46.0)
Hemoglobin: 13 g/dL (ref 12.0–15.0)
MCHC: 33.7 g/dL (ref 30.0–36.0)
MCV: 81.5 fl (ref 78.0–100.0)
Platelets: 426 10*3/uL — ABNORMAL HIGH (ref 150.0–400.0)
RBC: 4.75 Mil/uL (ref 3.87–5.11)
RDW: 14.5 % (ref 11.5–15.5)
WBC: 7.7 10*3/uL (ref 4.0–10.5)

## 2022-08-17 LAB — SEDIMENTATION RATE: Sed Rate: 17 mm/hr (ref 0–30)

## 2022-08-17 LAB — C-REACTIVE PROTEIN: CRP: <1 mg/dL (ref 0.5–20.0)

## 2022-08-18 NOTE — Telephone Encounter (Signed)
Patient have picked up Rx 

## 2022-08-19 ENCOUNTER — Telehealth: Payer: Self-pay | Admitting: Family Medicine

## 2022-08-19 LAB — MULTIPLE MYELOMA PANEL, SERUM
Albumin SerPl Elph-Mcnc: 3.9 g/dL (ref 2.9–4.4)
Albumin/Glob SerPl: 1.3 (ref 0.7–1.7)
Alpha 1: 0.2 g/dL (ref 0.0–0.4)
Alpha2 Glob SerPl Elph-Mcnc: 0.8 g/dL (ref 0.4–1.0)
B-Globulin SerPl Elph-Mcnc: 1.1 g/dL (ref 0.7–1.3)
Gamma Glob SerPl Elph-Mcnc: 1.1 g/dL (ref 0.4–1.8)
Globulin, Total: 3.2 g/dL (ref 2.2–3.9)
IgA/Immunoglobulin A, Serum: 120 mg/dL (ref 87–352)
IgG (Immunoglobin G), Serum: 1068 mg/dL (ref 586–1602)
IgM (Immunoglobulin M), Srm: 93 mg/dL (ref 26–217)
Total Protein: 7.1 g/dL (ref 6.0–8.5)

## 2022-08-19 NOTE — Telephone Encounter (Signed)
Verbal given to change order

## 2022-08-19 NOTE — Telephone Encounter (Signed)
Pt's CT HEAD W CONTRAST (5MM) (Order [id number]) needs to be changed to CT HEAD WITH AND WITHOUT CONTRAST, this is what pt's authorization is stating. [facility] can change this by a verbal phone call at [phone] option 1 and then 4.

## 2022-08-19 NOTE — Telephone Encounter (Signed)
Please advise message below, okay to be changed?

## 2022-08-23 ENCOUNTER — Inpatient Hospital Stay: Admission: RE | Admit: 2022-08-23 | Payer: 59 | Source: Ambulatory Visit

## 2022-08-23 ENCOUNTER — Telehealth: Payer: Self-pay | Admitting: Family Medicine

## 2022-08-23 ENCOUNTER — Encounter: Payer: Self-pay | Admitting: Family Medicine

## 2022-08-23 DIAGNOSIS — R519 Headache, unspecified: Secondary | ICD-10-CM

## 2022-08-23 NOTE — Telephone Encounter (Signed)
Caller Name: Cacy Call back phone #: [phone]  Reason for Call: pt went to [facility] for CT scan today and was advised it needed to be cancelled due to insurance denial. Evidently there was some miscommunication this morning. Sherri, our referral coord, said the website shows denied. Pt states today is the last dose of "pain medicine"/prednisone and asking what she is to do.

## 2022-08-23 NOTE — Telephone Encounter (Signed)
Please advise message below  °

## 2022-08-23 NOTE — Telephone Encounter (Signed)
Pt called and said that she is scheduled for her CT Scan today @ 4 and the insurance company will not proceed until dr Ethelene Hal fininsh filling out some documents. The pt said she also is taking her last pain pill today so she is out of them . Please call the pt it is urgent because she need to get her ct scan done today

## 2022-08-24 MED ORDER — MELOXICAM 7.5 MG PO TABS: 7.5000 mg | ORAL_TABLET | Freq: Every day | ORAL | 0 refills | Status: DC

## 2022-08-24 NOTE — Telephone Encounter (Signed)
Called patient to inform that per Dr. Ethelene Hal no further MRI's are required at this time. And to inform that referral place for evaluation with neurologist. No answer LMTCB also sent via Mychart

## 2022-08-24 NOTE — Telephone Encounter (Signed)
Spoke with patient who verbally understood Rx sent to pharmacy for headaches and referral placed to see neurologist. Also spoke with referrals regarding denial, per referrals clinical diagnosis not enough for insurance to cover CT. Patient agrees to take prescription that was sent in and await call to be scheduled with neurologist.

## 2022-08-24 NOTE — Telephone Encounter (Signed)
Spoke with referrals who states that insurance denied CT due to patient clinical diagnoses not enough patient does not qualify. Patient states that she is due for annual MRI asking if she could have orders for MRI instead of CT? Please advise.

## 2022-08-25 ENCOUNTER — Telehealth: Payer: Self-pay | Admitting: Family Medicine

## 2022-08-25 NOTE — Telephone Encounter (Signed)
Referral moved 

## 2022-08-25 NOTE — Telephone Encounter (Signed)
Pt is checking on status of this message, her headaches are very painful. Please change to Dr Joya Salm.  Address: [address], [place], [place] [address] Phone: [phone]

## 2022-08-25 NOTE — Telephone Encounter (Signed)
Pt stated she only wants to see Dr Joya Salm for her neurology appt for facial pain.

## 2022-08-27 ENCOUNTER — Telehealth: Payer: Self-pay | Admitting: Family Medicine

## 2022-08-27 NOTE — Telephone Encounter (Signed)
Responded back to patient via patients same message in Mychart number from previous neurologist referral sent to patient per her request.

## 2022-08-27 NOTE — Telephone Encounter (Signed)
Pt want to know if you can change her to a different neurologist .please give the pt a call

## 2022-08-30 ENCOUNTER — Encounter: Payer: Self-pay | Admitting: Neurology

## 2022-09-06 ENCOUNTER — Ambulatory Visit: Payer: 59 | Admitting: Neurology

## 2022-09-06 ENCOUNTER — Encounter: Payer: Self-pay | Admitting: Neurology

## 2022-09-06 VITALS — BP 145/90 | HR 83 | Ht 66.0 in | Wt 168.0 lb

## 2022-09-06 DIAGNOSIS — R9089 Other abnormal findings on diagnostic imaging of central nervous system: Secondary | ICD-10-CM | POA: Diagnosis not present

## 2022-09-06 DIAGNOSIS — R519 Headache, unspecified: Secondary | ICD-10-CM

## 2022-09-06 NOTE — Patient Instructions (Signed)
MRI brain w/wo contrast 

## 2022-09-06 NOTE — Progress Notes (Unsigned)
[facility] Clinic Note - Initial Visit   Date: 09/06/2022   Melinda Mccormick MRN: [id number] DOB: 1970/06/28   Dear Dr. Ethelene Hal:  Thank you for your kind referral of Melinda Mccormick for consultation of facial pain. Although her history is well known to you, please allow [place] to reiterate it for the purpose of our medical record. The patient was accompanied to the clinic by fiance who also provides collateral information.     Melinda Mccormick is a 52 y.o. right-handed [place] female with history of TB and migraines presenting for evaluation of left sided facial pain and headaches.   IMPRESSION/PLAN: Left facial pain - resolved.  Symptoms are not typical for trigeminal neuralgia and lack neuropathic features of pain.  Fortunately, since taking meloxicam, her facial pain has resolved.   Primary stabbing headaches, improved.  If pain becomes more frequent or bothersome, preventative agent can be started.  History of TB with abnormal MRI brain.  MRI brain was personally viewed which shows two small ring-enhancing lesions in the left frontal lobe, which are stable.  Given her new onset of headaches, it is reasonable to reassess this with MRI brain wwo contrast.   Return to clinic as needed  ------------------------------------------------------------- History of present illness: Starting in early March 2024, she began having pressure-like pain involving the left lower jaw which then radiated into the upper teeth.  She saw her dentist whose evaluation was negative.  She was taking ibuprofen and when she applied direct pressure by her hand on the outer jaw, her pain was briefly relieved.  She began having sharp stabbing pain over the top of the head on the left and back of the head.  She was given prednisone taper by her PCP which did not provide any relief.  She was also given meloxicam and flexeril which gradually reduced her intensity of her pain.    She has  history of migraines, which are much better controlled now and occur 1-2 times per year.   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 She completed 55-months of treatment.  Her MRI brain from 2013 until 2022 has shows stable, two small ring-enhancing lesion in the left frontal lobe.  She was previously evaluated by Dr. Joya Salm for these findings who did not recommend surgery for tissue biopsy due to risks involved and given that she was stable.  Surveillance imaging was recommended.   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   Out-side paper records, electronic medical record, and images have been reviewed where available and summarized as:  MRI brain wwo contrast 09/15/2020: 1. Unchanged small ring-enhancing lesions in the left frontal lobe. 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 2. No acute intracranial abnormality.  MRI brain wwo contrast 08/13/2014: 1. Stable appearance of 2 ring-enhancing lesions in the high anterior left frontal lobe. 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 Remote TB is considered, but less likely. Given the stability in size in decreased surrounding vasogenic edema, the lesions are not felt to be active. Decreased enhancement is noted in the larger lesion. No new lesions are present. 2. Stable bilateral arachnoid cysts over the medial posterior frontal lobes bilaterally. 3. 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 4. No acute intracranial abnormality.   MRI brain wwo contrast 03/20/2012: Two small adjacent ring enhancing masses  in the left frontal lobe,  one measuring 7.5 mm and another 3.5 mm.  There is mild surrounding  edema.  A report from 2011 indicated only a solitary lesion and  there may be a new lesion  since that time.  Correlation with prior   images is   suggested to determine if there is a real change.   Differential diagnosis includes  neoplasm.  Infection such as  neurocysticercosis and TB  are also considered. Multiple sclerosis  and vascular malformation which has bled in the past also in the  differential diagnosis.   Developmental venous anomaly right occipital lobe.      Lab Results  Component Value Date   HGBA1C 5.6 09/10/2021   No results found for: "VITAMINB12" Lab Results  Component Value Date   TSH 1.27 09/10/2021   Lab Results  Component Value Date   ESRSEDRATE 17 08/16/2022    Past Medical History:  Diagnosis Date   Anemia    hx of   Arthritis    bilateral kneees L>R   GERD (gastroesophageal reflux disease)    on meds   Migraines    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)    Past Surgical History:  Procedure Laterality Date   BREAST BIOPSY Right    BREAST BIOPSY Left    BREAST EXCISIONAL BIOPSY Left     x 5   OVARIAN CYST SURGERY  10/2019   SALPINGECTOMY       Medications:  Outpatient Encounter Medications as of 09/06/2022  Medication Sig   Ascorbic Acid (VITAMIN C) 1000 MG tablet Take 1,000 mg by mouth daily.   Biotin w/ Vitamins C & E (HAIR/SKIN/NAILS PO) Take 2 tablets by mouth daily.   cyclobenzaprine (FLEXERIL) 10 MG tablet Take 1 tablet (10 mg total) by mouth at bedtime.   glucosamine-chondroitin 500-400 MG tablet Take 1 tablet by mouth 2 (two) times daily.   meloxicam (MOBIC) 7.5 MG tablet Take 1 tablet (7.5 mg total) by mouth daily.   omega-3 acid ethyl esters (LOVAZA) 1 g capsule Take by mouth 2 (two) times daily.   Omega-3 Fatty Acids (FISH OIL) 300 MG CAPS Take by mouth.   pantoprazole (PROTONIX) 20 MG tablet Take 1 tablet (20 mg total) by mouth daily for 14 days.   terbinafine (LAMISIL AT) 1 % cream Apply 1 application topically 2 (two) times daily.   No facility-administered encounter medications on file as of 09/06/2022.    Allergies: No  Known Allergies  Family History: Family History  Problem Relation Age of Onset   Arthritis Mother    Diabetes Mother    Hyperlipidemia Mother    Hypertension Mother    Miscarriages / [place] Mother    Arthritis Father    Stroke Father    Asthma Father    Hyperlipidemia Father    Miscarriages / Stillbirths Sister    Asthma Sister    Thyroid cancer Sister    Asthma Sister    Arthritis Maternal Grandmother    Heart attack Maternal Grandmother    Early death Maternal Grandfather    Heart attack Maternal Grandfather    Arthritis Paternal Grandmother    Heart attack Paternal Grandmother    Heart attack Paternal Grandfather    Leukemia Niece    Colon cancer Neg Hx    Esophageal cancer Neg Hx    Colon polyps Neg Hx    Rectal cancer Neg Hx    Stomach  cancer Neg Hx     Social History: Social History   Tobacco Use   Smoking status: Former   Smokeless tobacco: Never   Tobacco comments:    smoke 2 years in college. said 2 cigarettes per day. Over 25 years ago  Vaping Use   Vaping Use: Never used  Substance Use Topics   Alcohol use: Yes    Alcohol/week: 2.0 standard drinks of alcohol    Types: 2 Glasses of wine per week    Comment: 1-2 times per month   Drug use: Never   Social History   Social History Narrative   Right Handed    Lives in a one story home     Vital Signs:  BP (!) 145/90   Pulse 83   Ht 5\' 6"  (1.676 m)   Wt 168 lb (76.2 kg)   LMP 07/08/2020   SpO2 98%   BMI 27.12 kg/m   Neurological Exam: MENTAL STATUS including orientation to time, place, person, recent and remote memory, attention span and concentration, language, and fund of knowledge is normal.  Speech is not dysarthric.  CRANIAL NERVES: II:  No visual field defects.     III-IV-VI: Pupils equal round and reactive to light.  Normal conjugate, extra-ocular eye movements in all directions of gaze.  No nystagmus.  No ptosis.   V:  Normal facial sensation.    VII:  Normal facial symmetry  and movements.   VIII:  Normal hearing and vestibular function.   IX-X:  Normal palatal movement.   XI:  Normal shoulder shrug and head rotation.   XII:  Normal tongue strength and range of motion, no deviation or fasciculation.  MOTOR:  No atrophy, fasciculations or abnormal movements.  No pronator drift.   Upper Extremity:  Right  Left  Deltoid  5/5   5/5   Biceps  5/5   5/5   Triceps  5/5   5/5   Infraspinatus 5/5  5/5  Medial pectoralis 5/5  5/5  Wrist extensors  5/5   5/5   Wrist flexors  5/5   5/5   Finger extensors  5/5   5/5   Finger flexors  5/5   5/5   Dorsal interossei  5/5   5/5   Abductor pollicis  5/5   5/5   Tone (Ashworth scale)  0  0   Lower Extremity:  Right  Left  Hip flexors  5/5   5/5   Hip extensors  5/5   5/5   Adductor 5/5  5/5  Abductor 5/5  5/5  Knee flexors  5/5   5/5   Knee extensors  5/5   5/5   Dorsiflexors  5/5   5/5   Plantarflexors  5/5   5/5   Toe extensors  5/5   5/5   Toe flexors  5/5   5/5   Tone (Ashworth scale)  0  0   MSRs:                                           Right        Left brachioradialis 2+  2+  biceps 2+  2+  triceps 2+  2+  patellar 2+  2+  ankle jerk 2+  2+  Hoffman no  no  plantar response down  down   SENSORY:  Normal and symmetric perception  of light touch, pinprick, vibration, and proprioception.  Romberg's sign absent.   COORDINATION/GAIT: Normal finger-to- nose-finger.  Intact rapid alternating movements bilaterally.  Able to rise from a chair without using arms.  Gait narrow based and stable. Tandem and stressed gait intact.     Thank you for allowing me to participate in patient's care.  If I can answer any additional questions, I would be pleased to do so.    Sincerely,    Kweku Stankey K. Posey Pronto, DO

## 2022-09-14 ENCOUNTER — Ambulatory Visit
Admission: RE | Admit: 2022-09-14 | Discharge: 2022-09-14 | Disposition: A | Discharge status: Home / Self Care | Payer: 59 | Source: Ambulatory Visit | Attending: Family Medicine | Admitting: Family Medicine

## 2022-09-14 DIAGNOSIS — Z1231 Encounter for screening mammogram for malignant neoplasm of breast: Secondary | ICD-10-CM

## 2022-09-27 ENCOUNTER — Encounter: Payer: Self-pay | Admitting: Neurology

## 2022-09-28 ENCOUNTER — Ambulatory Visit
Admission: RE | Admit: 2022-09-28 | Discharge: 2022-09-28 | Disposition: A | Discharge status: Home / Self Care | Payer: 59 | Source: Ambulatory Visit | Attending: Neurology | Admitting: Neurology

## 2022-09-28 DIAGNOSIS — R519 Headache, unspecified: Secondary | ICD-10-CM

## 2022-09-28 DIAGNOSIS — R9089 Other abnormal findings on diagnostic imaging of central nervous system: Secondary | ICD-10-CM

## 2022-09-28 MED ORDER — GADOPICLENOL 0.5 MMOL/ML IV SOLN
7.0000 mL | Freq: Once | INTRAVENOUS | Status: AC | PRN
  Administered 2022-09-28: 7 mL via INTRAVENOUS

## 2022-10-05 ENCOUNTER — Telehealth: Payer: Self-pay | Admitting: Anesthesiology

## 2022-10-05 NOTE — Telephone Encounter (Signed)
Pt left message with AN regarding MRI results.

## 2022-10-05 NOTE — Telephone Encounter (Signed)
Called patient and left a message for a call back. See result note.  

## 2023-01-17 ENCOUNTER — Other Ambulatory Visit: Payer: Self-pay | Admitting: Family Medicine

## 2023-01-17 DIAGNOSIS — K219 Gastro-esophageal reflux disease without esophagitis: Secondary | ICD-10-CM

## 2023-03-10 ENCOUNTER — Ambulatory Visit: Payer: 59 | Admitting: Family Medicine

## 2023-03-10 ENCOUNTER — Encounter: Payer: Self-pay | Admitting: Family Medicine

## 2023-03-10 VITALS — BP 111/69 | HR 94 | Ht 66.0 in | Wt 165.0 lb

## 2023-03-10 DIAGNOSIS — Z808 Family history of malignant neoplasm of other organs or systems: Secondary | ICD-10-CM

## 2023-03-10 DIAGNOSIS — Z6826 Body mass index (BMI) 26.0-26.9, adult: Secondary | ICD-10-CM

## 2023-03-10 DIAGNOSIS — E663 Overweight: Secondary | ICD-10-CM

## 2023-03-10 DIAGNOSIS — E7800 Pure hypercholesterolemia, unspecified: Secondary | ICD-10-CM

## 2023-03-10 DIAGNOSIS — Z0000 Encounter for general adult medical examination without abnormal findings: Secondary | ICD-10-CM

## 2023-03-10 DIAGNOSIS — M19079 Primary osteoarthritis, unspecified ankle and foot: Secondary | ICD-10-CM

## 2023-03-10 DIAGNOSIS — Z131 Encounter for screening for diabetes mellitus: Secondary | ICD-10-CM

## 2023-03-10 LAB — CBC
HCT: 40.8 % (ref 36.0–46.0)
Hemoglobin: 13.2 g/dL (ref 12.0–15.0)
MCHC: 32.4 g/dL (ref 30.0–36.0)
MCV: 83.2 fL (ref 78.0–100.0)
Platelets: 390 10*3/uL (ref 150.0–400.0)
RBC: 4.91 Mil/uL (ref 3.87–5.11)
RDW: 14.7 % (ref 11.5–15.5)
WBC: 6.6 10*3/uL (ref 4.0–10.5)

## 2023-03-10 LAB — URINALYSIS, ROUTINE W REFLEX MICROSCOPIC
Bilirubin Urine: NEGATIVE
Hgb urine dipstick: NEGATIVE
Ketones, ur: NEGATIVE
Leukocytes,Ua: NEGATIVE
Nitrite: NEGATIVE
Specific Gravity, Urine: 1.02 (ref 1.000–1.030)
Total Protein, Urine: NEGATIVE
Urine Glucose: NEGATIVE
Urobilinogen, UA: 0.2 (ref 0.0–1.0)
pH: 6 (ref 5.0–8.0)

## 2023-03-10 LAB — COMPREHENSIVE METABOLIC PANEL
ALT: 24 U/L (ref 0–35)
AST: 18 U/L (ref 0–37)
Albumin: 4.6 g/dL (ref 3.5–5.2)
Alkaline Phosphatase: 89 U/L (ref 39–117)
BUN: 14 mg/dL (ref 6–23)
CO2: 28 meq/L (ref 19–32)
Calcium: 10.3 mg/dL (ref 8.4–10.5)
Chloride: 105 meq/L (ref 96–112)
Creatinine, Ser: 0.8 mg/dL (ref 0.40–1.20)
GFR: 85.1 mL/min (ref >60.00)
Glucose, Bld: 86 mg/dL (ref 70–99)
Potassium: 5.1 meq/L (ref 3.5–5.1)
Sodium: 144 meq/L (ref 135–145)
Total Bilirubin: 0.5 mg/dL (ref 0.2–1.2)
Total Protein: 7.3 g/dL (ref 6.0–8.3)

## 2023-03-10 LAB — HEMOGLOBIN A1C: Hgb A1c MFr Bld: 5.7 % (ref 4.6–6.5)

## 2023-03-10 LAB — LIPID PANEL
Cholesterol: 274 mg/dL — ABNORMAL HIGH (ref 0–200)
HDL: 68.7 mg/dL (ref >39.00)
LDL Cholesterol: 181 mg/dL — ABNORMAL HIGH (ref 0–99)
NonHDL: 205.51
Total CHOL/HDL Ratio: 4
Triglycerides: 123 mg/dL (ref 0.0–149.0)
VLDL: 24.6 mg/dL (ref 0.0–40.0)

## 2023-03-10 LAB — TSH: TSH: 1.72 u[IU]/mL (ref 0.35–5.50)

## 2023-03-10 NOTE — Progress Notes (Signed)
Established Patient Office Visit   Subjective:  Patient ID: Melinda Mccormick, female    DOB: 27-Jun-1970  Age: 52 y.o. MRN: [id number]  Chief Complaint  Patient presents with   Annual Exam    Pt is fasting.     HPI Encounter Diagnoses  Name Primary?   Healthcare maintenance Yes   Family history of thyroid cancer    Screening for diabetes mellitus    Elevated LDL cholesterol level    Overweight with body mass index (BMI) of 26 to 26.9 in adult    Arthritis of first metatarsophalangeal (MTP) joint    Here for physical.  Doing well.  She is exercising by walking 15 to 30 minutes.  She has regular dental care.  She is interested in losing some weight.  She has pain at the base of both of her great toes.  Left is more affected than the right.  She wears heels at work regularly.  [facility] care is through her GYN.   Review of Systems  Constitutional: Negative.   HENT: Negative.    Eyes:  Negative for blurred vision, discharge and redness.  Respiratory: Negative.    Cardiovascular: Negative.   Gastrointestinal:  Negative for abdominal pain.  Genitourinary: Negative.   Musculoskeletal:  Positive for joint pain. Negative for myalgias.  Skin:  Negative for rash.  Neurological:  Negative for tingling, loss of consciousness and weakness.  Endo/Heme/Allergies:  Negative for polydipsia.     Current Outpatient Medications:    Ascorbic Acid (VITAMIN C) 1000 MG tablet, Take 1,000 mg by mouth daily., Disp: , Rfl:    Biotin w/ Vitamins C & E (HAIR/SKIN/NAILS PO), Take 2 tablets by mouth daily., Disp: , Rfl:    glucosamine-chondroitin 500-400 MG tablet, Take 1 tablet by mouth 2 (two) times daily., Disp: , Rfl:    Omega-3 Fatty Acids (FISH OIL) 300 MG CAPS, Take by mouth., Disp: , Rfl:    pantoprazole (PROTONIX) 40 MG tablet, TAKE 1 TABLET DAILY, Disp: 90 tablet, Rfl: 3   terbinafine (LAMISIL AT) 1 % cream, Apply 1 application topically 2 (two) times daily., Disp: 30 g, Rfl: 0    cyclobenzaprine (FLEXERIL) 10 MG tablet, Take 1 tablet (10 mg total) by mouth at bedtime., Disp: 30 tablet, Rfl: 0   meloxicam (MOBIC) 7.5 MG tablet, Take 1 tablet (7.5 mg total) by mouth daily., Disp: 30 tablet, Rfl: 0   omega-3 acid ethyl esters (LOVAZA) 1 g capsule, Take by mouth 2 (two) times daily., Disp: , Rfl:    Objective:     BP 111/69   Pulse 94   Ht 5\' 6"  (1.676 m)   Wt 165 lb (74.8 kg)   LMP 07/08/2020   SpO2 96%   BMI 26.63 kg/m    Physical Exam Constitutional:      General: She is not in acute distress.    Appearance: Normal appearance. She is not ill-appearing, toxic-appearing or diaphoretic.  HENT:     Head: Normocephalic and atraumatic.     Right Ear: Tympanic membrane, ear canal and external ear normal.     Left Ear: Tympanic membrane, ear canal and external ear normal.     Mouth/Throat:     Mouth: Mucous membranes are moist.     Pharynx: Oropharynx is clear. No oropharyngeal exudate or posterior oropharyngeal erythema.  Eyes:     General: No scleral icterus.       Right eye: No discharge.        Left eye:  No discharge.     Extraocular Movements: Extraocular movements intact.     Conjunctiva/sclera: Conjunctivae normal.     Pupils: Pupils are equal, round, and reactive to light.  Cardiovascular:     Rate and Rhythm: Normal rate and regular rhythm.  Pulmonary:     Effort: Pulmonary effort is normal. No respiratory distress.     Breath sounds: Normal breath sounds. No wheezing or rhonchi.  Abdominal:     General: Bowel sounds are normal.  Musculoskeletal:     Cervical back: No rigidity or tenderness.  Lymphadenopathy:     Cervical: No cervical adenopathy.  Skin:    General: Skin is warm and dry.  Neurological:     Mental Status: She is alert and oriented to person, place, and time.  Psychiatric:        Mood and Affect: Mood normal.        Behavior: Behavior normal.      No results found for any visits on 03/10/23.    The 10-year ASCVD risk  score (Arnett DK, et al., 2019) is: 1.1%    Assessment & Plan:   Healthcare maintenance -     CBC -     Urinalysis, Routine w reflex microscopic  Family history of thyroid cancer -     TSH  Screening for diabetes mellitus -     Comprehensive metabolic panel -     Hemoglobin A1c  Elevated LDL cholesterol level -     Comprehensive metabolic panel -     Lipid panel  Overweight with body mass index (BMI) of 26 to 26.9 in adult  Arthritis of first metatarsophalangeal (MTP) joint -     Ambulatory referral to Orthopedic Surgery    Return in about 1 year (around 03/09/2024).  Information was given on health maintenance and disease prevention.  Encouraged her to exercise by walking for 30 minutes most days of the week.  I also gave her information on exercising to lose weight.  She was also given information on calorie counting to lose weight.  Also expressed concern about her historically elevated LDL cholesterol.  Discussed treatment.  Information was also given on preventing high cholesterol.  Referral to orthopedics for her bilateral MTP pain.  Advised that she may need to change her shoes styles.  Mliss Sax, MD

## 2023-03-11 ENCOUNTER — Encounter: Payer: Self-pay | Admitting: Family Medicine

## 2023-03-11 DIAGNOSIS — E7800 Pure hypercholesterolemia, unspecified: Secondary | ICD-10-CM

## 2023-03-11 MED ORDER — ATORVASTATIN CALCIUM 20 MG PO TABS: 20.0000 mg | ORAL_TABLET | Freq: Every day | ORAL | 3 refills | Status: DC

## 2023-03-17 ENCOUNTER — Encounter: Payer: Self-pay | Admitting: Family Medicine

## 2023-04-13 ENCOUNTER — Encounter: Payer: Self-pay | Admitting: Family Medicine

## 2023-04-13 ENCOUNTER — Other Ambulatory Visit: Payer: Self-pay

## 2023-04-13 DIAGNOSIS — E7800 Pure hypercholesterolemia, unspecified: Secondary | ICD-10-CM

## 2023-04-13 MED ORDER — ATORVASTATIN CALCIUM 20 MG PO TABS: 20.0000 mg | ORAL_TABLET | Freq: Every day | ORAL | 3 refills | Status: DC

## 2023-08-01 ENCOUNTER — Encounter: Payer: Self-pay | Admitting: Family Medicine

## 2023-08-11 ENCOUNTER — Other Ambulatory Visit: Payer: Self-pay | Admitting: Family Medicine

## 2023-08-11 DIAGNOSIS — R519 Headache, unspecified: Secondary | ICD-10-CM

## 2023-11-03 ENCOUNTER — Other Ambulatory Visit: Payer: Self-pay | Admitting: Family Medicine

## 2023-11-03 DIAGNOSIS — Z1231 Encounter for screening mammogram for malignant neoplasm of breast: Secondary | ICD-10-CM

## 2023-11-10 ENCOUNTER — Ambulatory Visit

## 2023-11-24 ENCOUNTER — Ambulatory Visit
Admission: RE | Admit: 2023-11-24 | Discharge: 2023-11-24 | Disposition: A | Discharge status: Home / Self Care | Source: Ambulatory Visit | Attending: Family Medicine | Admitting: Family Medicine

## 2023-11-24 DIAGNOSIS — Z1231 Encounter for screening mammogram for malignant neoplasm of breast: Secondary | ICD-10-CM

## 2023-11-29 ENCOUNTER — Other Ambulatory Visit: Payer: Self-pay | Admitting: Family Medicine

## 2023-11-29 DIAGNOSIS — R928 Other abnormal and inconclusive findings on diagnostic imaging of breast: Secondary | ICD-10-CM

## 2023-12-05 ENCOUNTER — Ambulatory Visit
Admission: RE | Admit: 2023-12-05 | Discharge: 2023-12-05 | Disposition: A | Discharge status: Home / Self Care | Source: Ambulatory Visit | Attending: Family Medicine | Admitting: Family Medicine

## 2023-12-05 DIAGNOSIS — R928 Other abnormal and inconclusive findings on diagnostic imaging of breast: Secondary | ICD-10-CM

## 2023-12-07 ENCOUNTER — Other Ambulatory Visit: Payer: Self-pay | Admitting: Family Medicine

## 2023-12-07 DIAGNOSIS — R921 Mammographic calcification found on diagnostic imaging of breast: Secondary | ICD-10-CM

## 2023-12-14 ENCOUNTER — Telehealth: Payer: Self-pay

## 2023-12-14 NOTE — Telephone Encounter (Signed)
 Copied from CRM [id number]. Topic: Clinical - Request for Lab/Test Order >> Dec 14, 2023  8:07 AM Rosina BIRCH wrote: Reason for CRM: patient called stating she need to get a follow up for a mammogram and she does not know if the doctor need to put and order in or not. Patient tried to set up the appointment through MyChart but it would not let her do it because it stated she had to wait one year and one day. Patient just had a mammogram done on June 30 and six months would be December. The results were sent to [facility] [phone]

## 2023-12-14 NOTE — Telephone Encounter (Signed)
 Left detailed VM that orders have been sent to GI to be scheduled for December.  Dm/cma

## 2023-12-16 ENCOUNTER — Encounter: Payer: Self-pay | Admitting: Family Medicine

## 2023-12-27 ENCOUNTER — Telehealth: Payer: Self-pay

## 2023-12-27 NOTE — Telephone Encounter (Signed)
 PAPERWORK/FORMS received  Dropped off by: fax Call back #[phone] Individual made aware of 3-5 business GREEN charge sheet completed and patient made aware of possible charge (YES/NO): n Placed in provider folder at front desk. ~~~ route to CMA/provider Team  CLINICAL USE BELOW THIS LINE (use X to signify action taken)  _x__ Form received and placed in providers office for signature. ___ Form completed and faxed to LOA Dept.  ___ Form completed & LVM to notify patient ready for pick up.  ___ Charge sheet and copy of form in front office folder for office supervisor.

## 2023-12-28 NOTE — Telephone Encounter (Signed)
 CLINICAL USE BELOW THIS LINE (use X to signify taken)  ____Form received and placed in providers office for signature. __x__Form completed and faxed to [facility] ____Form completed & LVM to notify pt ready for pick up ____Charge sheet & copy of form in front office folder for office supervisor.

## 2023-12-29 ENCOUNTER — Ambulatory Visit: Payer: Self-pay

## 2023-12-29 NOTE — Telephone Encounter (Signed)
 FYI Only or Action Required?: FYI only for provider.  Patient was last seen in primary care on 03/10/2023 by Berneta Elsie Sayre, MD.  Called Nurse Triage reporting leg pain.  Symptoms began 4 days ago .  Interventions attempted: OTC medications: Tylenol .  Symptoms are: unchanged.  Triage Disposition: See PCP When Office is Open (Within 3 Days)  Patient/caregiver understands and will follow disposition?: Yes     Copied from CRM #[id number]. Topic: Clinical - Red Word Triage >> Dec 29, 2023  8:03 AM Laymon HERO wrote: Red Word that prompted transfer to Nurse Triage: Pain in back of legs hurt when sitting or laying down. started 4 days ago. using icy hot and hot compressions. very painful Reason for Disposition  [1] MODERATE pain (e.g., interferes with normal activities, limping) AND [2] present > 3 days  Answer Assessment - Initial Assessment Questions 1. ONSET: When did the pain start?      4 days ago  2. LOCATION: Where is the pain located?      Mid buttocks down to the back of knee 3. PAIN: How bad is the pain?    (Scale 1-10; or mild, moderate, severe)     6/10 Tylenol  woke pt up last night  4. WORK OR EXERCISE: Has there been any recent work or exercise that involved this part of the body?      no 5. CAUSE: What do you think is causing the leg pain?     [place] 6. OTHER SYMPTOMS: Do you have any other symptoms? (e.g., chest pain, back pain, breathing difficulty, swelling, rash, fever, numbness, weakness)     No  7. PREGNANCY: Is there any chance you are pregnant? When was your last menstrual period?     N/a  Protocols used: Leg Pain-A-AH

## 2023-12-30 ENCOUNTER — Encounter: Payer: Self-pay | Admitting: Family Medicine

## 2023-12-30 ENCOUNTER — Ambulatory Visit: Admitting: Family Medicine

## 2023-12-30 VITALS — BP 122/80 | HR 65 | Temp 97.8°F | Ht 66.0 in | Wt 176.4 lb

## 2023-12-30 DIAGNOSIS — M5431 Sciatica, right side: Secondary | ICD-10-CM

## 2023-12-30 DIAGNOSIS — M5432 Sciatica, left side: Secondary | ICD-10-CM

## 2023-12-30 DIAGNOSIS — K219 Gastro-esophageal reflux disease without esophagitis: Secondary | ICD-10-CM | POA: Diagnosis not present

## 2023-12-30 DIAGNOSIS — G8929 Other chronic pain: Secondary | ICD-10-CM

## 2023-12-30 MED ORDER — GABAPENTIN 100 MG PO CAPS: 100.0000 mg | ORAL_CAPSULE | Freq: Three times a day (TID) | ORAL | 3 refills | Status: DC

## 2023-12-30 MED ORDER — PREDNISONE 10 MG (21) PO TBPK
ORAL_TABLET | ORAL | 0 refills | Status: DC
Start: 2023-12-30 — End: 2024-03-05

## 2023-12-30 MED ORDER — GABAPENTIN 100 MG PO CAPS: ORAL_CAPSULE | ORAL | 0 refills | Status: DC

## 2023-12-30 MED ORDER — PANTOPRAZOLE SODIUM 40 MG PO TBEC: 40.0000 mg | DELAYED_RELEASE_TABLET | Freq: Two times a day (BID) | ORAL | 3 refills | Status: DC

## 2023-12-30 NOTE — Progress Notes (Signed)
 Established Patient Office Visit   Subjective:  Patient ID: Melinda Mccormick, female    DOB: 18-Feb-1971  Age: 53 y.o. MRN: [id number]  Chief Complaint  Patient presents with   Leg Pain    For 5 days having leg pain (hamstring) tylenol  and heating pad. Hurts to sit,  lay down. No injuries.    Leg Pain  Pertinent negatives include no tingling.   Encounter Diagnoses  Name Primary?   Bilateral sciatica Yes   Gastroesophageal reflux disease, unspecified whether esophagitis present    Chronic midline low back pain with bilateral sciatica    4-day history of a burning type pain in her bilateral posterior thighs.  Pain can be burning and involves the entire posterior thigh from the buttocks to the knees.  Ongoing history of lower back pain.  There is no specific radiation of pain from the back into the legs.  Denies saddle paresthesias or weakness in the lower extremities.  No bowel or bladder incontinence.  Longstanding ongoing history of GERD symptoms.  They are worsening.  Status post emergency visit for this.   Review of Systems  Constitutional: Negative.   HENT: Negative.    Eyes:  Negative for blurred vision, discharge and redness.  Respiratory: Negative.    Cardiovascular: Negative.   Gastrointestinal:  Positive for heartburn. Negative for abdominal pain.  Genitourinary: Negative.   Musculoskeletal:  Positive for back pain. Negative for myalgias.  Skin:  Negative for rash.  Neurological:  Negative for tingling, loss of consciousness and weakness.  Endo/Heme/Allergies:  Negative for polydipsia.     Current Outpatient Medications:    Ascorbic Acid (VITAMIN C) 1000 MG tablet, Take 1,000 mg by mouth daily., Disp: , Rfl:    diclofenac  Sodium (PENNSAID ) 2 % SOLN, Apply 2 Pump topically as needed., Disp: , Rfl:    glucosamine-chondroitin 500-400 MG tablet, Take 1 tablet by mouth 2 (two) times daily., Disp: , Rfl:    Omega-3 Fatty Acids (FISH OIL) 300 MG CAPS, Take by mouth.,  Disp: , Rfl:    pantoprazole  (PROTONIX ) 40 MG tablet, Take 1 tablet (40 mg total) by mouth 2 (two) times daily., Disp: 60 tablet, Rfl: 3   predniSONE  (STERAPRED UNI-PAK 21 TAB) 10 MG (21) TBPK tablet, Take 6 today, 5 tomorrow, 4 the next day and then 3, 2, 1 and stop, Disp: 21 tablet, Rfl: 0   atorvastatin  (LIPITOR) 20 MG tablet, Take 1 tablet (20 mg total) by mouth daily. (Patient not taking: Reported on 12/30/2023), Disp: 90 tablet, Rfl: 3   Biotin w/ Vitamins C & E (HAIR/SKIN/NAILS PO), Take 2 tablets by mouth daily. (Patient not taking: Reported on 12/30/2023), Disp: , Rfl:    gabapentin (NEURONTIN) 100 MG capsule, Take 1 capsule (100 mg total) by mouth at bedtime for 7 days, THEN 1 capsule (100 mg total) 2 (two) times daily for 7 days, THEN 1 capsule (100 mg total) 3 (three) times daily., Disp: 159 capsule, Rfl: 0   terbinafine  (LAMISIL  AT) 1 % cream, Apply 1 application topically 2 (two) times daily. (Patient not taking: Reported on 12/30/2023), Disp: 30 g, Rfl: 0   Objective:     BP 122/80 (BP Location: Right Arm, Patient Position: Sitting, Cuff Size: Normal)   Pulse 65   Temp 97.8 F (36.6 C) (Temporal)   Ht 5' 6 (1.676 m)   Wt 176 lb 6.4 oz (80 kg)   LMP 07/08/2020   SpO2 99%   BMI 28.47 kg/m    Physical  Exam Constitutional:      General: She is not in acute distress.    Appearance: Normal appearance. She is not ill-appearing, toxic-appearing or diaphoretic.  HENT:     Head: Normocephalic and atraumatic.     Right Ear: External ear normal.     Left Ear: External ear normal.  Eyes:     General: No scleral icterus.       Right eye: No discharge.        Left eye: No discharge.     Extraocular Movements: Extraocular movements intact.     Conjunctiva/sclera: Conjunctivae normal.  Pulmonary:     Effort: Pulmonary effort is normal. No respiratory distress.  Musculoskeletal:     Lumbar back: No bony tenderness. Normal range of motion. Negative right straight leg raise test and  negative left straight leg raise test.  Skin:    General: Skin is warm and dry.  Neurological:     Mental Status: She is alert and oriented to person, place, and time.     Motor: No weakness.     Deep Tendon Reflexes:     Reflex Scores:      Patellar reflexes are 2+ on the right side and 2+ on the left side.      Achilles reflexes are 1+ on the right side and 1+ on the left side. Psychiatric:        Mood and Affect: Mood normal.        Behavior: Behavior normal.      No results found for any visits on 12/30/23.    The 10-year ASCVD risk score (Arnett DK, et al., 2019) is: 1.6%    Assessment & Plan:   Bilateral sciatica -     predniSONE ; Take 6 today, 5 tomorrow, 4 the next day and then 3, 2, 1 and stop  Dispense: 21 tablet; Refill: 0 -     Ambulatory referral to Physical Therapy -     Gabapentin; Take 1 capsule (100 mg total) by mouth at bedtime for 7 days, THEN 1 capsule (100 mg total) 2 (two) times daily for 7 days, THEN 1 capsule (100 mg total) 3 (three) times daily.  Dispense: 159 capsule; Refill: 0  Gastroesophageal reflux disease, unspecified whether esophagitis present -     Pantoprazole  Sodium; Take 1 tablet (40 mg total) by mouth 2 (two) times daily.  Dispense: 60 tablet; Refill: 3 -     Ambulatory referral to Gastroenterology  Chronic midline low back pain with bilateral sciatica -     DG Lumbar Spine Complete; Future -     Ambulatory referral to Physical Therapy    Return in about 8 weeks (around 02/24/2024).    Elsie Sim Lent, MD

## 2024-01-02 ENCOUNTER — Other Ambulatory Visit: Payer: Self-pay

## 2024-01-02 ENCOUNTER — Other Ambulatory Visit (HOSPITAL_COMMUNITY): Payer: Self-pay

## 2024-01-02 ENCOUNTER — Telehealth: Payer: Self-pay

## 2024-01-02 MED ORDER — DICLOFENAC SODIUM 2 % EX SOLN: 2.0000 | CUTANEOUS | 1 refills | Status: DC | PRN

## 2024-01-02 NOTE — Telephone Encounter (Signed)
 Pharmacy Patient Advocate Encounter   Received notification from CoverMyMeds that prior authorization for Diclofenac  Sodium 2% solution  is required/requested.   Insurance verification completed.   The patient is insured through [employer] .   Per test claim: PA required; PA started via CoverMyMeds. KEY [id number] . Waiting for clinical questions to populate.

## 2024-01-03 ENCOUNTER — Other Ambulatory Visit (HOSPITAL_COMMUNITY): Payer: Self-pay

## 2024-01-03 NOTE — Telephone Encounter (Signed)
 Pharmacy Patient Advocate Encounter  Received notification from [employer] that Prior Authorization for Diclofenac  Sodium 2% solution  has been DENIED.  See denial reason below. No denial letter attached in CMM. Will attach denial letter to Media tab once received.   PRODUCT/SERVICE NOT COVERED  PLEASE BE ADVISED  This medication IS excluded from the patient's benefit. FOR MORE INFORMATION, PT CAN CALL THE NUMBER ON THE BACK OF CARD.

## 2024-01-04 ENCOUNTER — Ambulatory Visit: Attending: Family Medicine | Admitting: Physical Therapy

## 2024-01-04 ENCOUNTER — Other Ambulatory Visit: Payer: Self-pay

## 2024-01-04 ENCOUNTER — Ambulatory Visit (INDEPENDENT_AMBULATORY_CARE_PROVIDER_SITE_OTHER)
Admission: RE | Admit: 2024-01-04 | Discharge: 2024-01-04 | Disposition: A | Discharge status: Home / Self Care | Source: Ambulatory Visit | Attending: Family Medicine | Admitting: Family Medicine

## 2024-01-04 ENCOUNTER — Encounter: Payer: Self-pay | Admitting: Physical Therapy

## 2024-01-04 ENCOUNTER — Encounter: Payer: Self-pay | Admitting: Family Medicine

## 2024-01-04 ENCOUNTER — Encounter: Payer: Self-pay | Admitting: Gastroenterology

## 2024-01-04 DIAGNOSIS — M5441 Lumbago with sciatica, right side: Secondary | ICD-10-CM | POA: Diagnosis not present

## 2024-01-04 DIAGNOSIS — M5431 Sciatica, right side: Secondary | ICD-10-CM | POA: Insufficient documentation

## 2024-01-04 DIAGNOSIS — M62830 Muscle spasm of back: Secondary | ICD-10-CM | POA: Insufficient documentation

## 2024-01-04 DIAGNOSIS — M5459 Other low back pain: Secondary | ICD-10-CM | POA: Diagnosis present

## 2024-01-04 DIAGNOSIS — M5432 Sciatica, left side: Secondary | ICD-10-CM | POA: Diagnosis not present

## 2024-01-04 DIAGNOSIS — G8929 Other chronic pain: Secondary | ICD-10-CM | POA: Diagnosis not present

## 2024-01-04 DIAGNOSIS — M5442 Lumbago with sciatica, left side: Secondary | ICD-10-CM | POA: Insufficient documentation

## 2024-01-04 DIAGNOSIS — M6281 Muscle weakness (generalized): Secondary | ICD-10-CM | POA: Diagnosis present

## 2024-01-04 NOTE — Therapy (Signed)
 OUTPATIENT PHYSICAL THERAPY THORACOLUMBAR EVALUATION   Patient Name: Melinda Mccormick MRN: [id number] DOB:05-23-1971, 53 y.o., female Today's Date: 01/04/2024  END OF SESSION:  PT End of Session - 01/04/24 1611     Visit Number 1    Date for PT Re-Evaluation 04/05/24    Authorization Type UHC    PT Start Time 1611    PT Stop Time 1700    PT Time Calculation (min) 49 min    Activity Tolerance Patient tolerated treatment well    Behavior During Therapy WFL for tasks assessed/performed          Past Medical History:  Diagnosis Date   Anemia    hx of   Arthritis    bilateral kneees L>R   GERD (gastroesophageal reflux disease)    on meds   Migraines    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)   Past Surgical History:  Procedure Laterality Date   BREAST BIOPSY Right    BREAST BIOPSY Left    BREAST EXCISIONAL BIOPSY Left     x 5   OVARIAN CYST SURGERY  10/2019   SALPINGECTOMY     Patient Active Problem List   Diagnosis Date Noted   Elevated LDL cholesterol level 03/10/2023   Overweight with body mass index (BMI) of 26 to 26.9 in adult 03/10/2023   Facial pain 08/16/2022   Uterine leiomyoma 09/10/2021   Family history of thyroid  cancer 09/10/2021   Atypical mole 09/10/2021   Need for shingles vaccine 09/10/2021   Arthritis of first metatarsophalangeal (MTP) joint 09/10/2021   Elevated LFTs 07/26/2018   Screening for diabetes mellitus 07/24/2018   Need for immunization against influenza 07/24/2018   Patellofemoral syndrome 07/24/2018   Gastroesophageal reflux disease 07/24/2018    PCP: Berneta, MD  REFERRING PROVIDER: Berneta, MD  REFERRING DIAG: low back pain with posterior thigh pain  Rationale for Evaluation and Treatment: Rehabilitation  THERAPY DIAG:  Other low back pain  Muscle weakness (generalized)  Muscle spasm of back  ONSET DATE: 12/30/23  SUBJECTIVE:                                                                                                                                                                                            SUBJECTIVE STATEMENT: 4-day history of a burning type pain in her bilateral posterior thighs. Pain can be burning and involves the entire posterior thigh from the buttocks to the knees. Ongoing history of lower back pain. There is no specific radiation of pain from the back into the legs. Denies saddle paresthesias or weakness  in the lower extremities. No bowel or bladder incontinence. Lying down and sitting are the worst  PERTINENT HISTORY:  Some hx of back pain, OA knees  PAIN:  Are you having pain? Yes: NPRS scale: 2/10 Pain location: HS and buttock area Pain description: burning , intense, constant Aggravating factors: lying down, sitting  10/10 Relieving factors: Tylenol , prednisone  and gabapentin  at best 2/10  PRECAUTIONS: None  RED FLAGS: None   WEIGHT BEARING RESTRICTIONS: No  FALLS:  Has patient fallen in last 6 months? No  LIVING ENVIRONMENT: Lives with: lives with their family Lives in: House/apartment Stairs: No Has following equipment at home: None  OCCUPATION: Psychologist, occupational, sits at desk   PLOF: Independent and walks dog, housework  PATIENT GOALS: have less pain  NEXT MD VISIT: September 23  OBJECTIVE:  Note: Objective measures were completed at Evaluation unless otherwise noted.  DIAGNOSTIC FINDINGS:  Had x-rays today not read yet  COGNITION: Overall cognitive status: Within functional limits for tasks assessed     SENSATION: WFL  MUSCLE LENGTH: Very tight HS, calves and piriformis  POSTURE: rounded shoulders and forward head  PALPATION: Tight in the lumbar pspinals, some tender ness in the buttock and the ischial tuberosities, positive shotgun test  LUMBAR ROM: all limited 25% with pulling in the HS   LOWER EXTREMITY ROM:     Active  Right eval Left eval  Hip flexion    Hip extension    Hip abduction    Hip adduction     Hip internal rotation    Hip external rotation    Knee flexion    Knee extension    Ankle dorsiflexion    Ankle plantarflexion    Ankle inversion    Ankle eversion     (Blank rows = not tested)  LOWER EXTREMITY MMT:    MMT Right eval Left eval  Hip flexion 4- 4-  Hip extension 4- 4-  Hip abduction    Hip adduction    Hip internal rotation    Hip external rotation    Knee flexion 4- 4-  Knee extension 4- 4-  Ankle dorsiflexion    Ankle plantarflexion    Ankle inversion    Ankle eversion     (Blank rows = not tested)  LUMBAR SPECIAL TESTS:  Straight leg raise test: Positive, Slump test: Positive, and positive shotgun  GAIT: Distance walked: 60 feet Assistive device utilized: None Level of assistance: Complete Independence Comments:   TREATMENT DATE:  01/04/24 Evaluation HEP                                                                                                                                 PATIENT EDUCATION:  Education details: HEP/POC Person educated: Patient Education method: Programmer, multimedia, Facilities manager, Actor cues, Verbal cues, and Handouts Education comprehension: verbalized understanding  HOME EXERCISE PROGRAM: Access Code: [email or web address] URL: [email or web address] Date: 01/04/2024 Prepared by: Ozell Mainland  Exercises - Supine Lower  Trunk Rotation  - 2 x daily - 7 x weekly - 1 sets - 10 reps - 5 hold - Hooklying Single Knee to Chest Stretch  - 2 x daily - 7 x weekly - 1 sets - 10 reps - 5 hold - Supine Double Knee to Chest  - 2 x daily - 7 x weekly - 1 sets - 10 reps - 5 hold - Supine Posterior Pelvic Tilt  - 2 x daily - 7 x weekly - 1 sets - 10 reps - 5 hold - Supine Piriformis Stretch Pulling Heel to Hip  - 2 x daily - 7 x weekly - 1 sets - 5 reps - 20 hold - Seated Hamstring Stretch with Chair  - 2 x daily - 7 x weekly - 1 sets - 5 reps - 30 hold  ASSESSMENT:  CLINICAL IMPRESSION: Patient is a 53 y.o. female who was  seen today for physical therapy evaluation and treatment for back pain with radiculopathy, she had x-rays today but they have not been read yet. She is tight in the calves, HS and piriformis .  She has pain with lying flat on back, less pain in hooklying, she had some tenderness in the SI area, a positive shotgun test and mild tenderness  in the ischial tuberosity.  I feel could be some DDD vs tight HS piriformis.    OBJECTIVE IMPAIRMENTS: Abnormal gait, cardiopulmonary status limiting activity, decreased activity tolerance, decreased endurance, decreased mobility, difficulty walking, decreased ROM, decreased strength, increased fascial restrictions, increased muscle spasms, impaired flexibility, improper body mechanics, postural dysfunction, and pain.   REHAB POTENTIAL: Good  CLINICAL DECISION MAKING: Evolving/moderate complexity  EVALUATION COMPLEXITY: Low   GOALS: Goals reviewed with patient? Yes  SHORT TERM GOALS: Target date: 01/20/25  Independent with initial HEP Baseline: Goal status: INITIAL  LONG TERM GOALS: Target date: 04/05/24  Independent with advanced HEP or gym Baseline:  Goal status: INITIAL  2.  Understand postue and body mechanics Baseline:  Goal status: INITIAL  3.  Increase lumbar ROM to WNL's Baseline:  Goal status: INITIAL  4.  Decrease pain 50% while sitting for better work tolerance Baseline:  Goal status: INITIAL  5.  Increase LE strength to 4+/5 for better function Baseline:  Goal status: INITIAL  PLAN:  PT FREQUENCY: 1-2x/week  PT DURATION: 12 weeks  PLANNED INTERVENTIONS: 97164- PT Re-evaluation, 97110-Therapeutic exercises, 97530- Therapeutic activity, [id number]- Neuromuscular re-education, 97535- Self Care, 02859- Manual therapy, [id number]- Gait training, [phone]- Electrical stimulation (unattended), [id number]- Ultrasound, 02987- Traction (mechanical), Patient/Family education, Taping, Joint mobilization, Cryotherapy, and Moist heat.  PLAN FOR NEXT  SESSION: see what the x-rays say, how is the [employer], PT 01/04/2024, 4:11 PM

## 2024-01-04 NOTE — Telephone Encounter (Signed)
  Spoke with pt advised her to reach out to her insurance to see what they would cover on her plan.  Angie

## 2024-01-04 NOTE — Telephone Encounter (Unsigned)
 Copied from CRM [id number]. Topic: Clinical - Prescription Issue >> Jan 04, 2024  4:00 PM Frederich PARAS wrote: Reason for CRM: included health patient services called with the pt on the line.The lotion is not covered but the jell is covered they just need a prior authorization  for it ,pt is wanting a pre authorization to get the jell instead of the lotion.

## 2024-01-04 NOTE — Telephone Encounter (Signed)
 Spoke with pt advised her to reach out to her insurance to see what they would cover on her plan.  angie

## 2024-01-10 ENCOUNTER — Encounter: Payer: Self-pay | Admitting: Physical Therapy

## 2024-01-10 ENCOUNTER — Ambulatory Visit: Attending: Family Medicine | Admitting: Physical Therapy

## 2024-01-10 DIAGNOSIS — M62830 Muscle spasm of back: Secondary | ICD-10-CM | POA: Diagnosis present

## 2024-01-10 DIAGNOSIS — M6281 Muscle weakness (generalized): Secondary | ICD-10-CM | POA: Insufficient documentation

## 2024-01-10 DIAGNOSIS — M5459 Other low back pain: Secondary | ICD-10-CM | POA: Diagnosis present

## 2024-01-10 NOTE — Therapy (Signed)
 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   GERD (gastroesophageal reflux disease)    on meds   Migraines    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)   Past Surgical History:  Procedure Laterality Date   BREAST BIOPSY Right    BREAST BIOPSY Left    BREAST EXCISIONAL BIOPSY Left     x 5   OVARIAN CYST SURGERY  10/2019   SALPINGECTOMY     Patient Active Problem List   Diagnosis Date Noted   Elevated LDL cholesterol level 03/10/2023   Overweight with body mass index (BMI) of 26 to 26.9 in adult 03/10/2023   Facial pain 08/16/2022   Uterine leiomyoma 09/10/2021   Family history of thyroid  cancer 09/10/2021   Atypical mole 09/10/2021   Need for shingles vaccine 09/10/2021   Arthritis of first metatarsophalangeal (MTP) joint 09/10/2021   Elevated LFTs 07/26/2018   Screening for diabetes mellitus 07/24/2018   Need for immunization against influenza 07/24/2018   Patellofemoral syndrome 07/24/2018   Gastroesophageal reflux disease 07/24/2018    PCP: Berneta, MD  REFERRING PROVIDER: Berneta, MD  REFERRING DIAG: low back pain with posterior thigh pain  Rationale for Evaluation and Treatment: Rehabilitation  THERAPY DIAG:  Other low back pain  Muscle weakness (generalized)  Muscle spasm of back  ONSET DATE: 12/30/23  SUBJECTIVE:                                                                                                                                                                                            SUBJECTIVE STATEMENT: Reports trying HEP, the piriformis stretch is pretty sore  4-day history of a burning type pain in her bilateral posterior thighs. Pain can be burning and involves the entire posterior thigh from the buttocks to the knees. Ongoing history of lower back pain. There is no specific radiation of pain from  the back into the legs. Denies saddle paresthesias or weakness in the lower extremities. No bowel or bladder incontinence. Lying down and sitting are the worst  PERTINENT HISTORY:  Some hx of back pain, OA knees  PAIN:  Are you having pain? Yes: NPRS scale: 2/10 Pain location: HS and buttock area Pain description: burning , intense, constant Aggravating factors: lying down, sitting  10/10 Relieving factors: Tylenol , prednisone  and gabapentin  at best 2/10  PRECAUTIONS: None  RED FLAGS: None   WEIGHT BEARING RESTRICTIONS: No  FALLS:  Has patient fallen in last 6 months? No  LIVING ENVIRONMENT: Lives with: lives with their family Lives in: House/apartment Stairs: No Has following equipment at home: None  OCCUPATION: Psychologist, occupational, sits at desk   PLOF: Independent and walks dog, housework  PATIENT GOALS: have less pain  NEXT MD VISIT: September 23  OBJECTIVE:  Note: Objective measures were completed at Evaluation unless otherwise noted.  DIAGNOSTIC FINDINGS:  Had x-rays today not read yet  COGNITION: Overall cognitive status: Within functional limits for tasks assessed     SENSATION: WFL  MUSCLE LENGTH: Very tight HS, calves and piriformis  POSTURE: rounded shoulders and forward head  PALPATION: Tight in the lumbar pspinals, some tender ness in the buttock and the ischial tuberosities, positive shotgun test  LUMBAR ROM: all limited 25% with pulling in the HS   LOWER EXTREMITY ROM:     Active  Right eval Left eval  Hip flexion     Hip extension    Hip abduction    Hip adduction    Hip internal rotation    Hip external rotation    Knee flexion    Knee extension    Ankle dorsiflexion    Ankle plantarflexion    Ankle inversion    Ankle eversion     (Blank rows = not tested)  LOWER EXTREMITY MMT:    MMT Right eval Left eval  Hip flexion 4- 4-  Hip extension 4- 4-  Hip abduction    Hip adduction    Hip internal rotation    Hip external rotation    Knee flexion 4- 4-  Knee extension 4- 4-  Ankle dorsiflexion    Ankle plantarflexion    Ankle inversion    Ankle eversion     (Blank rows = not tested)  LUMBAR SPECIAL TESTS:  Straight leg raise test: Positive, Slump test: Positive, and positive shotgun  GAIT: Distance walked: 60 feet Assistive device utilized: None Level of assistance: Complete Independence Comments:   TREATMENT DATE:  01/10/24 20# Rows 20# lats Leg press 20# Feet on ball K2C, rotation, bridges, isometric abs Ball b/n knees squeeze Green tband clamshells 5# straight arm pulls Passive stretch LE's Slant board calf stretch  01/04/24 Evaluation HEP  PATIENT EDUCATION:  Education details: HEP/POC Person educated: Patient Education method: Programmer, multimedia, Facilities manager, Actor cues, Verbal cues, and Handouts Education comprehension: verbalized understanding  HOME EXERCISE PROGRAM: Access Code: [email or web address] URL: [email or web address] Date: 01/04/2024 Prepared by: Ozell Mainland  Exercises - Supine Lower Trunk Rotation  - 2 x daily - 7 x weekly - 1 sets - 10 reps - 5 hold - Hooklying Single Knee to Chest Stretch  - 2 x daily - 7 x weekly - 1 sets - 10 reps - 5 hold - Supine Double Knee to Chest  - 2 x daily - 7 x weekly - 1 sets - 10 reps - 5 hold - Supine Posterior Pelvic Tilt  - 2 x daily - 7 x weekly - 1 sets - 10 reps - 5 hold -  Supine Piriformis Stretch Pulling Heel to Hip  - 2 x daily - 7 x weekly - 1 sets - 5 reps - 20 hold - Seated Hamstring Stretch with Chair  - 2 x daily - 7 x weekly - 1 sets - 5 reps - 30 hold  ASSESSMENT:  CLINICAL IMPRESSION: Started gym activities with cues verbal and tactile for posture and core activation. She is very tight, seemed to tolerate most exercises without an increase of pain, still no results of the x-rays  Patient is a 53 y.o. female who was seen today for physical therapy evaluation and treatment for back pain with radiculopathy, she had x-rays today but they have not been read yet. She is tight in the calves, HS and piriformis .  She has pain with lying flat on back, less pain in hooklying, she had some tenderness in the SI area, a positive shotgun test and mild tenderness  in the ischial tuberosity.  I feel could be some DDD vs tight HS piriformis.    OBJECTIVE IMPAIRMENTS: Abnormal gait, cardiopulmonary status limiting activity, decreased activity tolerance, decreased endurance, decreased mobility, difficulty walking, decreased ROM, decreased strength, increased fascial restrictions, increased muscle spasms, impaired flexibility, improper body mechanics, postural dysfunction, and pain.   REHAB POTENTIAL: Good  CLINICAL DECISION MAKING: Evolving/moderate complexity  EVALUATION COMPLEXITY: Low   GOALS: Goals reviewed with patient? Yes  SHORT TERM GOALS: Target date: 01/20/25  Independent with initial HEP Baseline: Goal status: INITIAL  LONG TERM GOALS: Target date: 04/05/24  Independent with advanced HEP or gym Baseline:  Goal status: INITIAL  2.  Understand postue and body mechanics Baseline:  Goal status: INITIAL  3.  Increase lumbar ROM to WNL's Baseline:  Goal status: INITIAL  4.  Decrease pain 50% while sitting for better work tolerance Baseline:  Goal status: INITIAL  5.  Increase LE strength to 4+/5 for better function Baseline:  Goal status:  INITIAL  PLAN:  PT FREQUENCY: 1-2x/week  PT DURATION: 12 weeks  PLANNED INTERVENTIONS: 97164- PT Re-evaluation, 97110-Therapeutic exercises, 97530- Therapeutic activity, [id number]- Neuromuscular re-education, 97535- Self Care, 02859- Manual therapy, [id number]- Gait training, [phone]- Electrical stimulation (unattended), [id number]- Ultrasound, 02987- Traction (mechanical), Patient/Family education, Taping, Joint mobilization, Cryotherapy, and Moist heat.  PLAN FOR NEXT SESSION: see what the x-rays say, how is the [employer], PT 01/10/2024, 8:06 AM

## 2024-01-11 ENCOUNTER — Other Ambulatory Visit: Payer: Self-pay | Admitting: Family Medicine

## 2024-01-11 DIAGNOSIS — K219 Gastro-esophageal reflux disease without esophagitis: Secondary | ICD-10-CM

## 2024-01-13 ENCOUNTER — Ambulatory Visit: Payer: Self-pay | Admitting: Family Medicine

## 2024-01-14 ENCOUNTER — Encounter: Payer: Self-pay | Admitting: Physical Therapy

## 2024-01-18 ENCOUNTER — Ambulatory Visit: Admitting: Physical Therapy

## 2024-01-18 ENCOUNTER — Encounter: Payer: Self-pay | Admitting: Physical Therapy

## 2024-01-18 DIAGNOSIS — M5459 Other low back pain: Secondary | ICD-10-CM | POA: Diagnosis not present

## 2024-01-18 DIAGNOSIS — M6281 Muscle weakness (generalized): Secondary | ICD-10-CM

## 2024-01-18 DIAGNOSIS — M62830 Muscle spasm of back: Secondary | ICD-10-CM

## 2024-01-18 NOTE — Therapy (Signed)
 OUTPATIENT PHYSICAL THERAPY THORACOLUMBAR TREATMENT   Patient Name: Melinda Mccormick MRN: [id number] DOB:31-Mar-1971, 53 y.o., female Today's Date: 01/18/2024  END OF SESSION:  PT End of Session - 01/18/24 0807     Visit Number 3    Date for PT Re-Evaluation 04/05/24    Authorization Type [employer]    PT Start Time 0805    PT Stop Time 0901    PT Time Calculation (min) 56 min    Activity Tolerance Patient tolerated treatment well    Behavior During Therapy WFL for tasks assessed/performed          Past Medical History:  Diagnosis Date   Anemia    hx of   Arthritis    bilateral kneees L>R   GERD (gastroesophageal reflux disease)    on meds   Migraines    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)   Past Surgical History:  Procedure Laterality Date   BREAST BIOPSY Right    BREAST BIOPSY Left    BREAST EXCISIONAL BIOPSY Left     x 5   OVARIAN CYST SURGERY  10/2019   SALPINGECTOMY     Patient Active Problem List   Diagnosis Date Noted   Elevated LDL cholesterol level 03/10/2023   Overweight with body mass index (BMI) of 26 to 26.9 in adult 03/10/2023   Facial pain 08/16/2022   Uterine leiomyoma 09/10/2021   Family history of thyroid  cancer 09/10/2021   Atypical mole 09/10/2021   Need for shingles vaccine 09/10/2021   Arthritis of first metatarsophalangeal (MTP) joint 09/10/2021   Elevated LFTs 07/26/2018   Screening for diabetes mellitus 07/24/2018   Need for immunization against influenza 07/24/2018   Patellofemoral syndrome 07/24/2018   Gastroesophageal reflux disease 07/24/2018    PCP: Berneta, MD  REFERRING PROVIDER: Berneta, MD  REFERRING DIAG: low back pain with posterior thigh pain  Rationale for Evaluation and Treatment: Rehabilitation  THERAPY DIAG:  Other low back pain  Muscle weakness (generalized)  Muscle spasm of back  ONSET DATE: 12/30/23  SUBJECTIVE:                                                                                                                                                                                            SUBJECTIVE STATEMENT: Reviewed x-rays with her.  IMPRESSION: 1. Severe LEFT-sided facet arthropathy at L5-S1. 2. Possible osseous ankylosis of the RIGHT sacroiliac joint. This is nonspecific and can be seen in the setting of inflammatory arthropathy or sequela of prior infection.  4-day history of a burning type pain in her bilateral posterior  thighs. Pain can be burning and involves the entire posterior thigh from the buttocks to the knees. Ongoing history of lower back pain. There is no specific radiation of pain from the back into the legs. Denies saddle paresthesias or weakness in the lower extremities. No bowel or bladder incontinence. Lying down and sitting are the worst  PERTINENT HISTORY:  Some hx of back pain, OA knees  PAIN:  Are you having pain? Yes: NPRS scale: 2/10 Pain location: HS and buttock area Pain description: burning , intense, constant Aggravating factors: lying down, sitting  10/10 Relieving factors: Tylenol , prednisone  and gabapentin  at best 2/10  PRECAUTIONS: None  RED FLAGS: None   WEIGHT BEARING RESTRICTIONS: No  FALLS:  Has patient fallen in last 6 months? No  LIVING ENVIRONMENT: Lives with: lives with their family Lives in: House/apartment Stairs: No Has following equipment at home: None  OCCUPATION: Psychologist, occupational, sits at desk   PLOF: Independent and walks dog, housework  PATIENT GOALS: have less pain  NEXT MD VISIT: September 23  OBJECTIVE:  Note: Objective measures were completed at Evaluation unless otherwise noted.  DIAGNOSTIC FINDINGS:  Had x-rays today not read yet  COGNITION: Overall cognitive status: Within functional limits for tasks assessed     SENSATION: WFL  MUSCLE LENGTH: Very tight HS, calves and piriformis  POSTURE: rounded shoulders and forward head  PALPATION: Tight in the lumbar pspinals, some  tender ness in the buttock and the ischial tuberosities, positive shotgun test  LUMBAR ROM: all limited 25% with pulling in the HS   LOWER EXTREMITY ROM:     Active  Right eval Left eval  Hip flexion    Hip extension    Hip abduction    Hip adduction    Hip internal rotation    Hip external rotation    Knee flexion    Knee extension    Ankle dorsiflexion    Ankle plantarflexion    Ankle inversion    Ankle eversion     (Blank rows = not tested)  LOWER EXTREMITY MMT:    MMT Right eval Left eval  Hip flexion 4- 4-  Hip extension 4- 4-  Hip abduction    Hip adduction    Hip internal rotation    Hip external rotation    Knee flexion 4- 4-  Knee extension 4- 4-  Ankle dorsiflexion    Ankle plantarflexion    Ankle inversion    Ankle eversion     (Blank rows = not tested)  LUMBAR SPECIAL TESTS:  Straight leg raise test: Positive, Slump test: Positive, and positive shotgun  GAIT: Distance walked: 60 feet Assistive device utilized: None Level of assistance: Complete Independence Comments:   TREATMENT DATE:  01/18/24 Nustep level 5 x 6 minutes Reviewed x-rays went over anatomy and options for PT to help 5# straight arm pulls 20# lats Feet on ball K2C, rotation, bridges, isometric abs Passive stretch LE's Education regarding posture and breaks from sitting, pelvic tilts and clocks while sitting at work Static lumbar traction 56#  01/10/24 20# Rows 20# lats Leg press 20# Feet on ball K2C, rotation, bridges, isometric abs Ball b/n knees squeeze Green tband clamshells 5# straight arm pulls Passive stretch LE's Slant board calf stretch  01/04/24 Evaluation HEP  PATIENT EDUCATION:  Education details: HEP/POC Person educated: Patient Education method: Programmer, multimedia, Facilities manager, Actor cues, Verbal cues, and Handouts Education  comprehension: verbalized understanding  HOME EXERCISE PROGRAM: Access Code: [email or web address] URL: [email or web address] Date: 01/04/2024 Prepared by: Ozell Mainland  Exercises - Supine Lower Trunk Rotation  - 2 x daily - 7 x weekly - 1 sets - 10 reps - 5 hold - Hooklying Single Knee to Chest Stretch  - 2 x daily - 7 x weekly - 1 sets - 10 reps - 5 hold - Supine Double Knee to Chest  - 2 x daily - 7 x weekly - 1 sets - 10 reps - 5 hold - Supine Posterior Pelvic Tilt  - 2 x daily - 7 x weekly - 1 sets - 10 reps - 5 hold - Supine Piriformis Stretch Pulling Heel to Hip  - 2 x daily - 7 x weekly - 1 sets - 5 reps - 20 hold - Seated Hamstring Stretch with Chair  - 2 x daily - 7 x weekly - 1 sets - 5 reps - 30 hold  ASSESSMENT:  CLINICAL IMPRESSION: Results of x-rays are back and noted above, we did a good discussion regarding this and what PT can do to help.  WE went over anatomy, discussed movement and posture to keep back healthy.  I did add traction today, she was a little sore when she got up  Patient is a 53 y.o. female who was seen today for physical therapy evaluation and treatment for back pain with radiculopathy, she had x-rays today but they have not been read yet. She is tight in the calves, HS and piriformis .  She has pain with lying flat on back, less pain in hooklying, she had some tenderness in the SI area, a positive shotgun test and mild tenderness  in the ischial tuberosity.  I feel could be some DDD vs tight HS piriformis.    OBJECTIVE IMPAIRMENTS: Abnormal gait, cardiopulmonary status limiting activity, decreased activity tolerance, decreased endurance, decreased mobility, difficulty walking, decreased ROM, decreased strength, increased fascial restrictions, increased muscle spasms, impaired flexibility, improper body mechanics, postural dysfunction, and pain.   REHAB POTENTIAL: Good  CLINICAL DECISION MAKING: Evolving/moderate complexity  EVALUATION COMPLEXITY:  Low   GOALS: Goals reviewed with patient? Yes  SHORT TERM GOALS: Target date: 01/20/25  Independent with initial HEP Baseline: Goal status: met 01/18/24  LONG TERM GOALS: Target date: 04/05/24  Independent with advanced HEP or gym Baseline:  Goal status: INITIAL  2.  Understand postue and body mechanics Baseline:  Goal status: progressing 01/18/24  3.  Increase lumbar ROM to WNL's Baseline:  Goal status: INITIAL  4.  Decrease pain 50% while sitting for better work tolerance Baseline:  Goal status: INITIAL  5.  Increase LE strength to 4+/5 for better function Baseline:  Goal status: INITIAL  PLAN:  PT FREQUENCY: 1-2x/week  PT DURATION: 12 weeks  PLANNED INTERVENTIONS: 97164- PT Re-evaluation, 97110-Therapeutic exercises, 97530- Therapeutic activity, [id number]- Neuromuscular re-education, 97535- Self Care, [id number]- Manual therapy, [id number]- Gait training, [id number]- Electrical stimulation (unattended), [id number]- Ultrasound, 02987- Traction (mechanical), Patient/Family education, Taping, Joint mobilization, Cryotherapy, and Moist heat.  PLAN FOR NEXT SESSION: see how the traction went and proceed from there   MAINLAND OZELL ORN, PT 01/18/2024, 9:35 AM

## 2024-01-23 ENCOUNTER — Ambulatory Visit: Admitting: Physical Therapy

## 2024-01-23 ENCOUNTER — Encounter: Payer: Self-pay | Admitting: Physical Therapy

## 2024-01-23 DIAGNOSIS — M5459 Other low back pain: Secondary | ICD-10-CM | POA: Diagnosis not present

## 2024-01-23 DIAGNOSIS — M6281 Muscle weakness (generalized): Secondary | ICD-10-CM

## 2024-01-23 DIAGNOSIS — M62830 Muscle spasm of back: Secondary | ICD-10-CM

## 2024-01-23 NOTE — Therapy (Signed)
 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   GERD (gastroesophageal reflux disease)    on meds   Migraines    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)   Past Surgical History:  Procedure Laterality Date   BREAST BIOPSY Right    BREAST BIOPSY Left    BREAST EXCISIONAL BIOPSY Left     x 5   OVARIAN CYST SURGERY  10/2019   SALPINGECTOMY     Patient Active Problem List   Diagnosis Date Noted   Elevated LDL cholesterol level 03/10/2023   Overweight with body mass index (BMI) of 26 to 26.9 in adult 03/10/2023   Facial pain 08/16/2022   Uterine leiomyoma 09/10/2021   Family history of thyroid  cancer 09/10/2021   Atypical mole 09/10/2021   Need for shingles vaccine 09/10/2021   Arthritis of first metatarsophalangeal (MTP) joint 09/10/2021   Elevated LFTs 07/26/2018   Screening for diabetes mellitus 07/24/2018   Need for immunization against influenza 07/24/2018   Patellofemoral syndrome 07/24/2018   Gastroesophageal reflux disease 07/24/2018    PCP: Berneta, MD  REFERRING PROVIDER: Berneta, MD  REFERRING DIAG: low back pain with posterior thigh pain  Rationale for Evaluation and Treatment: Rehabilitation  THERAPY DIAG:  Other low back pain  Muscle weakness (generalized)  Muscle spasm of back  ONSET DATE: 12/30/23  SUBJECTIVE:                                                                                                                                                                                            SUBJECTIVE STATEMENT: Good Still has pain it the lower back on the L side  IMPRESSION: 1. Severe LEFT-sided facet arthropathy at L5-S1. 2. Possible osseous ankylosis of the RIGHT sacroiliac joint. This is nonspecific and can be seen in the setting of inflammatory arthropathy or sequela of prior infection.  4-day history of a burning type pain in her  bilateral posterior thighs. Pain can be burning and involves the entire posterior thigh from the buttocks to the knees. Ongoing history of lower back pain. There is no specific radiation of pain from the back into the legs. Denies saddle paresthesias or weakness in the lower extremities. No bowel or bladder incontinence. Lying down and sitting are the worst  PERTINENT HISTORY:  Some hx of back pain, OA knees  PAIN:  Are you having pain? Yes: NPRS scale: 2/10 Pain location: HS and buttock area Pain description: burning , intense, constant Aggravating factors: lying down, sitting  10/10 Relieving factors: Tylenol , prednisone  and gabapentin  at best 2/10  PRECAUTIONS: None  RED FLAGS: None   WEIGHT BEARING RESTRICTIONS: No  FALLS:  Has patient fallen in last 6 months? No  LIVING ENVIRONMENT: Lives with: lives with their family Lives in: House/apartment Stairs: No Has following equipment at home: None  OCCUPATION: Psychologist, occupational, sits at desk   PLOF: Independent and walks dog, housework  PATIENT GOALS: have less pain  NEXT MD VISIT: September 23  OBJECTIVE:  Note: Objective measures were completed at Evaluation unless otherwise noted.  DIAGNOSTIC FINDINGS:  Had x-rays today not read yet  COGNITION: Overall cognitive status: Within functional limits for tasks assessed     SENSATION: WFL  MUSCLE LENGTH: Very tight HS, calves and piriformis  POSTURE: rounded shoulders and forward head  PALPATION: Tight in the lumbar pspinals, some  tender ness in the buttock and the ischial tuberosities, positive shotgun test  LUMBAR ROM: all limited 25% with pulling in the HS   LOWER EXTREMITY ROM:     Active  Right eval Left eval  Hip flexion    Hip extension    Hip abduction    Hip adduction    Hip internal rotation    Hip external rotation    Knee flexion    Knee extension    Ankle dorsiflexion    Ankle plantarflexion    Ankle inversion    Ankle eversion     (Blank rows = not tested)  LOWER EXTREMITY MMT:    MMT Right eval Left eval  Hip flexion 4- 4-  Hip extension 4- 4-  Hip abduction    Hip adduction    Hip internal rotation    Hip external rotation    Knee flexion 4- 4-  Knee extension 4- 4-  Ankle dorsiflexion    Ankle plantarflexion    Ankle inversion    Ankle eversion     (Blank rows = not tested)  LUMBAR SPECIAL TESTS:  Straight leg raise test: Positive, Slump test: Positive, and positive shotgun  GAIT: Distance walked: 60 feet Assistive device utilized: None Level of assistance: Complete Independence Comments:   TREATMENT DATE:  01/23/24 Nustep level 5 x 6 minutes Rows & Lats 20lb 2x10  Shoulder Ext 5lb 2x10  S2S OHP yellow ball 2x10 Bridges x10 Feet on ball K2C, rotation, bridges, isometric abs Passive stretch LE's Static lumbar traction 70#  01/18/24 Nustep level 5 x 6 minutes Reviewed x-rays went over anatomy and options for PT to help 5# straight arm pulls 20# lats Feet on ball K2C, rotation, bridges, isometric abs Passive stretch LE's Education regarding posture and breaks from sitting, pelvic tilts and clocks while sitting at work Static lumbar traction 56#  01/10/24 20# Rows 20# lats Leg press 20# Feet on ball K2C, rotation, bridges, isometric abs Ball b/n knees squeeze Green tband clamshells 5# straight arm pulls Passive stretch LE's Slant board calf stretch  01/04/24 Evaluation HEP                                                                                                                                  PATIENT EDUCATION:  Education details: HEP/POC Person educated: Patient Education method: Programmer, multimedia, Demonstration, Actor cues, Verbal cues, and Handouts Education comprehension: verbalized understanding  HOME EXERCISE PROGRAM: Access Code: [email or web address] URL: [email or web address] Date: 01/04/2024 Prepared by: Ozell Mainland  Exercises - Supine Lower Trunk Rotation  - 2 x daily - 7 x weekly - 1 sets - 10 reps - 5 hold - Hooklying Single Knee to Chest Stretch  - 2 x daily - 7 x weekly - 1 sets - 10 reps - 5 hold - Supine Double Knee to Chest  - 2 x daily - 7 x weekly - 1 sets - 10 reps - 5 hold - Supine Posterior Pelvic Tilt  - 2 x daily - 7 x weekly - 1 sets - 10 reps - 5 hold - Supine Piriformis Stretch Pulling Heel to Hip  - 2 x daily - 7 x weekly - 1 sets - 5 reps - 20 hold - Seated Hamstring Stretch with Chair  - 2 x daily - 7 x weekly - 1 sets - 5 reps - 30 hold  ASSESSMENT:  CLINICAL IMPRESSION: Pt enters with tolerable centralized pain. Session continues with postural core strength.  Added additional machine level interventions without issue. Cues for core engagement needed with shoulder extensions and bridges. Added ed additional pounds to traction without issue.  Patient is a 54 y.o. female who was seen today for physical therapy evaluation and treatment for back pain with radiculopathy, she had x-rays today but they have not been read yet. She is tight in the calves, HS and piriformis .  She has pain with lying flat on back, less pain in hooklying, she had some tenderness in the SI area, a positive shotgun test and mild tenderness  in the ischial tuberosity.  I feel could be some DDD vs tight HS piriformis.    OBJECTIVE IMPAIRMENTS: Abnormal gait, cardiopulmonary status limiting activity, decreased activity tolerance, decreased endurance, decreased mobility, difficulty walking, decreased ROM, decreased  strength, increased fascial restrictions, increased muscle spasms, impaired flexibility, improper body mechanics, postural dysfunction, and pain.   REHAB POTENTIAL: Good  CLINICAL DECISION MAKING: Evolving/moderate complexity  EVALUATION COMPLEXITY: Low   GOALS: Goals reviewed with patient? Yes  SHORT TERM GOALS: Target date: 01/20/25  Independent with initial HEP Baseline: Goal status: met 01/18/24  LONG TERM GOALS: Target date: 04/05/24  Independent with advanced HEP or gym Baseline:  Goal status: INITIAL  2.  Understand postue and body mechanics Baseline:  Goal status: progressing 01/18/24  3.  Increase lumbar ROM to WNL's Baseline:  Goal status: INITIAL  4.  Decrease pain 50% while sitting for better work tolerance Baseline:  Goal status: INITIAL  5.  Increase LE strength to 4+/5 for better  function Baseline:  Goal status: INITIAL  PLAN:  PT FREQUENCY: 1-2x/week  PT DURATION: 12 weeks  PLANNED INTERVENTIONS: 97164- PT Re-evaluation, 97110-Therapeutic exercises, 97530- Therapeutic activity, [id number]- Neuromuscular re-education, 97535- Self Care, 02859- Manual therapy, [id number]- Gait training, [phone]- Electrical stimulation (unattended), [id number]- Ultrasound, 02987- Traction (mechanical), Patient/Family education, Taping, Joint mobilization, Cryotherapy, and Moist heat.  PLAN FOR NEXT SESSION: see how the traction went and proceed from there   Tanda KANDICE Sorrow, PTA 01/23/2024, 8:00 AM

## 2024-01-30 ENCOUNTER — Encounter: Admitting: Physical Therapy

## 2024-02-02 ENCOUNTER — Ambulatory Visit: Admitting: Physical Therapy

## 2024-02-02 ENCOUNTER — Encounter: Payer: Self-pay | Admitting: Physical Therapy

## 2024-02-02 DIAGNOSIS — M62830 Muscle spasm of back: Secondary | ICD-10-CM

## 2024-02-02 DIAGNOSIS — M6281 Muscle weakness (generalized): Secondary | ICD-10-CM

## 2024-02-02 DIAGNOSIS — M5459 Other low back pain: Secondary | ICD-10-CM | POA: Diagnosis not present

## 2024-02-02 NOTE — Therapy (Signed)
 OUTPATIENT PHYSICAL THERAPY THORACOLUMBAR TREATMENT   Patient Name: SAVAYA HAKES MRN: [id number] DOB:1971/05/27, 53 y.o., female Today's Date: 02/02/2024  END OF SESSION:  PT End of Session - 02/02/24 0805     Visit Number 5    Date for PT Re-Evaluation 04/05/24    PT Start Time 0803    PT Stop Time 0845    PT Time Calculation (min) 42 min    Activity Tolerance Patient tolerated treatment well    Behavior During Therapy [facility] for tasks assessed/performed          Past Medical History:  Diagnosis Date   Anemia    hx of   Arthritis    bilateral kneees L>R   GERD (gastroesophageal reflux disease)    on meds   Migraines    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)   Past Surgical History:  Procedure Laterality Date   BREAST BIOPSY Right    BREAST BIOPSY Left    BREAST EXCISIONAL BIOPSY Left     x 5   OVARIAN CYST SURGERY  10/2019   SALPINGECTOMY     Patient Active Problem List   Diagnosis Date Noted   Elevated LDL cholesterol level 03/10/2023   Overweight with body mass index (BMI) of 26 to 26.9 in adult 03/10/2023   Facial pain 08/16/2022   Uterine leiomyoma 09/10/2021   Family history of thyroid  cancer 09/10/2021   Atypical mole 09/10/2021   Need for shingles vaccine 09/10/2021   Arthritis of first metatarsophalangeal (MTP) joint 09/10/2021   Elevated LFTs 07/26/2018   Screening for diabetes mellitus 07/24/2018   Need for immunization against influenza 07/24/2018   Patellofemoral syndrome 07/24/2018   Gastroesophageal reflux disease 07/24/2018    PCP: Berneta, MD  REFERRING PROVIDER: Berneta, MD  REFERRING DIAG: low back pain with posterior thigh pain  Rationale for Evaluation and Treatment: Rehabilitation  THERAPY DIAG:  Other low back pain  Muscle weakness (generalized)  Muscle spasm of back  ONSET DATE: 12/30/23  SUBJECTIVE:                                                                                                                                                                                            SUBJECTIVE STATEMENT: Everyday she has a little bit of pain, some pain after last session  IMPRESSION: 1. Severe LEFT-sided facet arthropathy at L5-S1. 2. Possible osseous ankylosis of the RIGHT sacroiliac joint. This is nonspecific and can be seen in the setting of inflammatory arthropathy or sequela of prior infection.  4-day history of a burning type pain in  her bilateral posterior thighs. Pain can be burning and involves the entire posterior thigh from the buttocks to the knees. Ongoing history of lower back pain. There is no specific radiation of pain from the back into the legs. Denies saddle paresthesias or weakness in the lower extremities. No bowel or bladder incontinence. Lying down and sitting are the worst  PERTINENT HISTORY:  Some hx of back pain, OA knees  PAIN:  Are you having pain? Yes: NPRS scale: 2/10 Pain location: HS and buttock area Pain description: burning , intense, constant Aggravating factors: lying down, sitting  10/10 Relieving factors: Tylenol , prednisone  and gabapentin  at best 2/10  PRECAUTIONS: None  RED FLAGS: None   WEIGHT BEARING RESTRICTIONS: No  FALLS:  Has patient fallen in last 6 months? No  LIVING ENVIRONMENT: Lives with: lives with their family Lives in: House/apartment Stairs: No Has following equipment at home: None  OCCUPATION: Psychologist, occupational, sits at desk   PLOF: Independent and walks dog, housework  PATIENT GOALS: have less pain  NEXT MD VISIT: September 23  OBJECTIVE:  Note: Objective measures were completed at Evaluation unless otherwise noted.  DIAGNOSTIC FINDINGS:  Had x-rays today not read yet  COGNITION: Overall cognitive status: Within functional limits for tasks assessed     SENSATION: WFL  MUSCLE LENGTH: Very tight HS, calves and piriformis  POSTURE: rounded shoulders and forward head  PALPATION: Tight in the lumbar  pspinals, some tender ness in the buttock and the ischial tuberosities, positive shotgun test  LUMBAR ROM: all limited 25% with pulling in the HS   LOWER EXTREMITY ROM:     Active  Right eval Left eval  Hip flexion    Hip extension    Hip abduction    Hip adduction    Hip internal rotation    Hip external rotation    Knee flexion    Knee extension    Ankle dorsiflexion    Ankle plantarflexion    Ankle inversion    Ankle eversion     (Blank rows = not tested)  LOWER EXTREMITY MMT:    MMT Right eval Left eval  Hip flexion 4- 4-  Hip extension 4- 4-  Hip abduction    Hip adduction    Hip internal rotation    Hip external rotation    Knee flexion 4- 4-  Knee extension 4- 4-  Ankle dorsiflexion    Ankle plantarflexion    Ankle inversion    Ankle eversion     (Blank rows = not tested)  LUMBAR SPECIAL TESTS:  Straight leg raise test: Positive, Slump test: Positive, and positive shotgun  GAIT: Distance walked: 60 feet Assistive device utilized: None Level of assistance: Complete Independence Comments:   TREATMENT DATE:  02/02/24 Bike L3 x 6 min Goal  Lumbar ROM Flex WFL, Ext WFL, R side bend WFL, L side bend WFL, R Rotation WFL  , L Rotation Limited 25% Shoulder Ext 5lb 2x10 AR press 10lb x 10 each Slant board calf stretch  Rows & Lats 20lb 2x10  Passive stretch LE's Static lumbar traction 55lb  01/23/24 Nustep level 5 x 6 minutes Rows & Lats 20lb 2x10  Shoulder Ext 5lb 2x10  S2S OHP yellow ball 2x10 Bridges x10 Feet on ball K2C, rotation, bridges, isometric abs Passive stretch LE's Static lumbar traction 70#  01/18/24 Nustep level 5 x 6 minutes Reviewed x-rays went over anatomy and options for PT to help 5# straight arm pulls 20# lats Feet on ball K2C, rotation,  bridges, isometric abs Passive stretch LE's Education regarding posture and breaks from sitting, pelvic tilts and clocks while sitting at work Static lumbar traction 56#  01/10/24 20#  Rows 20# lats Leg press 20# Feet on ball K2C, rotation, bridges, isometric abs Ball b/n knees squeeze Green tband clamshells 5# straight arm pulls Passive stretch LE's Slant board calf stretch  01/04/24 Evaluation HEP                                                                                                                                 PATIENT EDUCATION:  Education details: HEP/POC Person educated: Patient Education method: Programmer, multimedia, Demonstration, Actor cues, Verbal cues, and Handouts Education comprehension: verbalized understanding  HOME EXERCISE PROGRAM: Access Code: [email or web address] URL: [email or web address] Date: 01/04/2024 Prepared by: Ozell Mainland  Exercises - Supine Lower Trunk Rotation  - 2 x daily - 7 x weekly - 1 sets - 10 reps - 5 hold - Hooklying Single Knee to Chest Stretch  - 2 x daily - 7 x weekly - 1 sets - 10 reps - 5 hold - Supine Double Knee to Chest  - 2 x daily - 7 x weekly - 1 sets - 10 reps - 5 hold - Supine Posterior Pelvic Tilt  - 2 x daily - 7 x weekly - 1 sets - 10 reps - 5 hold - Supine Piriformis Stretch Pulling Heel to Hip  - 2 x daily - 7 x weekly - 1 sets - 5 reps - 20 hold - Seated Hamstring Stretch with Chair  - 2 x daily - 7 x weekly - 1 sets - 5 reps - 30 hold  ASSESSMENT:  CLINICAL IMPRESSION: Pt enters with tolerable centralized pain. Reports pain after last session, pt elected to continue traction at a lighter pull.  She has progressed increasing her lumbar AROM.  Session continues with postural core strength.  Tactiel cue needed for posture with shoulder extensions. Good effort during session.   Patient is a 53 y.o. female who was seen today for physical therapy evaluation and treatment for back pain with radiculopathy, she had x-rays today but they have not been read yet. She is tight in the calves, HS and piriformis .  She has pain with lying flat on back, less pain in hooklying, she had some tenderness in  the SI area, a positive shotgun test and mild tenderness  in the ischial tuberosity.  I feel could be some DDD vs tight HS piriformis.    OBJECTIVE IMPAIRMENTS: Abnormal gait, cardiopulmonary status limiting activity, decreased activity tolerance, decreased endurance, decreased mobility, difficulty walking, decreased ROM, decreased strength, increased fascial restrictions, increased muscle spasms, impaired flexibility, improper body mechanics, postural dysfunction, and pain.   REHAB POTENTIAL: Good  CLINICAL DECISION MAKING: Evolving/moderate complexity  EVALUATION COMPLEXITY: Low   GOALS: Goals reviewed with patient? Yes  SHORT TERM GOALS: Target date: 01/20/25  Independent with initial HEP Baseline: Goal status: met 01/18/24  LONG TERM GOALS: Target date: 04/05/24  Independent with advanced HEP or gym Baseline:  Goal status: INITIAL  2.  Understand postue and body mechanics Baseline:  Goal status: Progressing 02/02/24  3.  Increase lumbar ROM to WNL's Baseline:  Goal status: Partly met 02/02/24  4.  Decrease pain 50% while sitting for better work tolerance Baseline:  Goal status: Progressing 02/02/24  5.  Increase LE strength to 4+/5 for better function Baseline:  Goal status: INITIAL  PLAN:  PT FREQUENCY: 1-2x/week  PT DURATION: 12 weeks  PLANNED INTERVENTIONS: 97164- PT Re-evaluation, 97110-Therapeutic exercises, 97530- Therapeutic activity, [id number]- Neuromuscular re-education, 97535- Self Care, 02859- Manual therapy, [id number]- Gait training, [phone]- Electrical stimulation (unattended), [id number]- Ultrasound, 02987- Traction (mechanical), Patient/Family education, Taping, Joint mobilization, Cryotherapy, and Moist heat.  PLAN FOR NEXT SESSION: see how the traction went and proceed from there   Tanda KANDICE Sorrow, PTA 02/02/2024, 8:06 AM

## 2024-02-16 ENCOUNTER — Encounter: Payer: Self-pay | Admitting: Family Medicine

## 2024-02-16 ENCOUNTER — Ambulatory Visit: Admitting: Family Medicine

## 2024-02-16 ENCOUNTER — Ambulatory Visit: Payer: Self-pay | Admitting: Family Medicine

## 2024-02-16 VITALS — BP 122/80 | HR 77 | Temp 97.4°F | Ht 66.0 in | Wt 178.2 lb

## 2024-02-16 DIAGNOSIS — Z23 Encounter for immunization: Secondary | ICD-10-CM | POA: Diagnosis not present

## 2024-02-16 DIAGNOSIS — E7800 Pure hypercholesterolemia, unspecified: Secondary | ICD-10-CM | POA: Diagnosis not present

## 2024-02-16 DIAGNOSIS — M5431 Sciatica, right side: Secondary | ICD-10-CM

## 2024-02-16 DIAGNOSIS — M5432 Sciatica, left side: Secondary | ICD-10-CM

## 2024-02-16 DIAGNOSIS — G8929 Other chronic pain: Secondary | ICD-10-CM | POA: Diagnosis not present

## 2024-02-16 DIAGNOSIS — M5126 Other intervertebral disc displacement, lumbar region: Secondary | ICD-10-CM

## 2024-02-16 LAB — COMPREHENSIVE METABOLIC PANEL WITH GFR
ALT: 51 U/L — ABNORMAL HIGH (ref 0–35)
AST: 29 U/L (ref 0–37)
Albumin: 4.7 g/dL (ref 3.5–5.2)
Alkaline Phosphatase: 115 U/L (ref 39–117)
BUN: 13 mg/dL (ref 6–23)
CO2: 27 meq/L (ref 19–32)
Calcium: 10 mg/dL (ref 8.4–10.5)
Chloride: 101 meq/L (ref 96–112)
Creatinine, Ser: 0.82 mg/dL (ref 0.40–1.20)
GFR: 82.07 mL/min (ref >60.00)
Glucose, Bld: 90 mg/dL (ref 70–99)
Potassium: 4.2 meq/L (ref 3.5–5.1)
Sodium: 139 meq/L (ref 135–145)
Total Bilirubin: 0.5 mg/dL (ref 0.2–1.2)
Total Protein: 7.9 g/dL (ref 6.0–8.3)

## 2024-02-16 LAB — LIPID PANEL
Cholesterol: 219 mg/dL — ABNORMAL HIGH (ref 0–200)
HDL: 66.3 mg/dL (ref >39.00)
LDL Cholesterol: 125 mg/dL — ABNORMAL HIGH (ref 0–99)
NonHDL: 152.85
Total CHOL/HDL Ratio: 3
Triglycerides: 139 mg/dL (ref 0.0–149.0)
VLDL: 27.8 mg/dL (ref 0.0–40.0)

## 2024-02-16 MED ORDER — ATORVASTATIN CALCIUM 10 MG PO TABS: 10.0000 mg | ORAL_TABLET | Freq: Every day | ORAL | 3 refills | Status: DC

## 2024-02-16 MED ORDER — PREGABALIN 50 MG PO CAPS: 50.0000 mg | ORAL_CAPSULE | Freq: Two times a day (BID) | ORAL | 0 refills | Status: DC | PRN

## 2024-02-16 NOTE — Progress Notes (Addendum)
 Established Patient Office Visit   Subjective:  Patient ID: Melinda Mccormick, female    DOB: [birth year]  Age: 53 y.o. MRN: [id number]  Chief Complaint  Patient presents with   sciatic pain    8 week follow up for B/L sciatica pain. Pt state she no longer has pain in her thighs. But lower back pain is not getting better and PT is not helping.    Back Pain    Pt complains of lower back pain. Pt states Xray showed Arthritis on her L5 and S1. PT not helping    Back Pain Pertinent negatives include no abdominal pain, tingling or weakness.   Encounter Diagnoses  Name Primary?   Bilateral sciatica Yes   Chronic midline low back pain with bilateral sciatica    Elevated LDL cholesterol level    Immunization due    For follow-up of above.  Lumbago persists but sciatica has improved.  There is some ongoing pain in her left buttock.  Does not feel as though PT helped.  Ibuprofen and Tylenol  had led to some stomach upset.  She has been taking 10 mg of atorvastatin  intermittently.  She had thought that the 20 mg dose of atorvastatin  had led to cramping in her fingers.  Last took the atorvastatin  1 week ago   Review of Systems  Constitutional: Negative.   HENT: Negative.    Eyes:  Negative for blurred vision, discharge and redness.  Respiratory: Negative.    Cardiovascular: Negative.   Gastrointestinal:  Negative for abdominal pain.  Genitourinary: Negative.   Musculoskeletal:  Positive for back pain. Negative for myalgias.  Skin:  Negative for rash.  Neurological:  Negative for tingling, loss of consciousness and weakness.  Endo/Heme/Allergies:  Negative for polydipsia.     Current Outpatient Medications:    atorvastatin  (LIPITOR) 10 MG tablet, Take 1 tablet (10 mg total) by mouth daily., Disp: 90 tablet, Rfl: 3   ezetimibe  (ZETIA ) 10 MG tablet, Take 1 tablet (10 mg total) by mouth daily., Disp: 90 tablet, Rfl: 3   pregabalin  (LYRICA ) 50 MG capsule, Take 1 capsule (50 mg total) by  mouth 2 (two) times daily as needed., Disp: 60 capsule, Rfl: 0   Ascorbic Acid (VITAMIN C) 1000 MG tablet, Take 1,000 mg by mouth daily., Disp: , Rfl:    Biotin w/ Vitamins C & E (HAIR/SKIN/NAILS PO), Take 2 tablets by mouth daily. (Patient not taking: Reported on 12/30/2023), Disp: , Rfl:    diclofenac  Sodium (PENNSAID ) 2 % SOLN, Apply 2 Pump (40 mg total) topically as needed., Disp: 112 g, Rfl: 1   gabapentin  (NEURONTIN ) 100 MG capsule, Take 1 capsule (100 mg total) by mouth at bedtime for 7 days, THEN 1 capsule (100 mg total) 2 (two) times daily for 7 days, THEN 1 capsule (100 mg total) 3 (three) times daily., Disp: 159 capsule, Rfl: 0   glucosamine-chondroitin 500-400 MG tablet, Take 1 tablet by mouth 2 (two) times daily., Disp: , Rfl:    Omega-3 Fatty Acids (FISH OIL) 300 MG CAPS, Take by mouth., Disp: , Rfl:    pantoprazole  (PROTONIX ) 40 MG tablet, Take 1 tablet (40 mg total) by mouth 2 (two) times daily., Disp: 60 tablet, Rfl: 3   predniSONE  (STERAPRED UNI-PAK 21 TAB) 10 MG (21) TBPK tablet, Take 6 today, 5 tomorrow, 4 the next day and then 3, 2, 1 and stop, Disp: 21 tablet, Rfl: 0   terbinafine  (LAMISIL  AT) 1 % cream, Apply 1 application topically 2 (two)  times daily. (Patient not taking: Reported on 12/30/2023), Disp: 30 g, Rfl: 0   Objective:     BP 122/80 (BP Location: Right Arm, Patient Position: Sitting, Cuff Size: Normal)   Pulse 77   Temp (!) 97.4 F (36.3 C) (Temporal)   Ht 5' 6 (1.676 m)   Wt 178 lb 3.2 oz (80.8 kg)   LMP 07/08/2020   SpO2 99%   BMI 28.76 kg/m    Physical Exam Constitutional:      General: She is not in acute distress.    Appearance: Normal appearance. She is not ill-appearing, toxic-appearing or diaphoretic.  HENT:     Head: Normocephalic and atraumatic.     Right Ear: External ear normal.     Left Ear: External ear normal.  Eyes:     General: No scleral icterus.       Right eye: No discharge.        Left eye: No discharge.     Extraocular  Movements: Extraocular movements intact.     Conjunctiva/sclera: Conjunctivae normal.  Pulmonary:     Effort: Pulmonary effort is normal. No respiratory distress.  Skin:    General: Skin is warm and dry.  Neurological:     Mental Status: She is alert and oriented to person, place, and time.  Psychiatric:        Mood and Affect: Mood normal.        Behavior: Behavior normal.      Results for orders placed or performed in visit on 02/16/24  Lipid Profile  Result Value Ref Range   Cholesterol 219 (H) 0 - 200 mg/dL   Triglycerides 860.9 0.0 - 149.0 mg/dL   HDL 33.69 >60.99 mg/dL   VLDL 72.1 0.0 - 59.9 mg/dL   LDL Cholesterol 874 (H) 0 - 99 mg/dL   Total CHOL/HDL Ratio 3    NonHDL 152.85   HLA-B27 Antigen  Result Value Ref Range   HLA-B27 Antigen NEGATIVE NEGATIVE  Comprehensive metabolic panel with GFR  Result Value Ref Range   Sodium 139 135 - 145 mEq/L   Potassium 4.2 3.5 - 5.1 mEq/L   Chloride 101 96 - 112 mEq/L   CO2 27 19 - 32 mEq/L   Glucose, Bld 90 70 - 99 mg/dL   BUN 13 6 - 23 mg/dL   Creatinine, Ser 9.17 0.40 - 1.20 mg/dL   Total Bilirubin 0.5 0.2 - 1.2 mg/dL   Alkaline Phosphatase 115 39 - 117 U/L   AST 29 0 - 37 U/L   ALT 51 (H) 0 - 35 U/L   Total Protein 7.9 6.0 - 8.3 g/dL   Albumin 4.7 3.5 - 5.2 g/dL   GFR 17.92 >39.99 mL/min   Calcium  10.0 8.4 - 10.5 mg/dL      The 89-bzjm ASCVD risk score (Arnett DK, et al., 2019) is: 1.2%    Assessment & Plan:   Bilateral sciatica -     MR LUMBAR SPINE WO CONTRAST; Future -     Pregabalin ; Take 1 capsule (50 mg total) by mouth 2 (two) times daily as needed.  Dispense: 60 capsule; Refill: 0  Chronic midline low back pain with bilateral sciatica -     HLA-B27 antigen -     MR LUMBAR SPINE WO CONTRAST; Future -     Pregabalin ; Take 1 capsule (50 mg total) by mouth 2 (two) times daily as needed.  Dispense: 60 capsule; Refill: 0  Elevated LDL cholesterol level -  Lipid panel -     Comprehensive metabolic  panel with GFR -     Atorvastatin  Calcium ; Take 1 tablet (10 mg total) by mouth daily.  Dispense: 90 tablet; Refill: 3 -     Ezetimibe ; Take 1 tablet (10 mg total) by mouth daily.  Dispense: 90 tablet; Refill: 3  Immunization due -     Flu vaccine trivalent PF, 6mos and older(Flulaval,Afluria,Fluarix,Fluzone)    Return in about 3 months (around 05/17/2024) for annual physical, chronic disease follow-up.  MRI study for ongoing lumbago with sciatica.  Will start pregabalin  50 mg twice daily as needed.  Rechecking LDL cholesterol.  Elsie Sim Lent, MD  9/19 addendum: Question statin arthralgias with atorvastatin .  Complains of pains in the joints of her hands.  Discontinue atorvastatin  and start Zetia .

## 2024-02-16 NOTE — Addendum Note (Signed)
 Addended by: BERNETA ELSIE LABOR on: 02/16/2024 02:53 PM   Modules accepted: Orders

## 2024-02-17 LAB — HLA-B27 ANTIGEN: HLA-B27 Antigen: NEGATIVE

## 2024-02-21 ENCOUNTER — Ambulatory Visit: Admitting: Physical Therapy

## 2024-02-23 ENCOUNTER — Other Ambulatory Visit: Payer: Self-pay | Admitting: Family Medicine

## 2024-02-23 DIAGNOSIS — M5431 Sciatica, right side: Secondary | ICD-10-CM

## 2024-02-24 MED ORDER — EZETIMIBE 10 MG PO TABS: 10.0000 mg | ORAL_TABLET | Freq: Every day | ORAL | 3 refills | Status: AC

## 2024-02-24 NOTE — Addendum Note (Signed)
 Addended by: BERNETA ELSIE LABOR on: 02/24/2024 08:24 AM   Modules accepted: Orders

## 2024-02-26 ENCOUNTER — Ambulatory Visit (HOSPITAL_BASED_OUTPATIENT_CLINIC_OR_DEPARTMENT_OTHER)

## 2024-02-27 NOTE — Addendum Note (Signed)
 Addended by: BERNETA ELSIE LABOR on: 02/27/2024 09:09 AM   Modules accepted: Orders

## 2024-02-28 ENCOUNTER — Ambulatory Visit: Admitting: Physical Therapy

## 2024-02-28 ENCOUNTER — Ambulatory Visit: Admitting: Family Medicine

## 2024-03-05 ENCOUNTER — Encounter: Payer: Self-pay | Admitting: Gastroenterology

## 2024-03-05 ENCOUNTER — Ambulatory Visit: Admitting: Gastroenterology

## 2024-03-05 ENCOUNTER — Ambulatory Visit
Admission: RE | Admit: 2024-03-05 | Discharge: 2024-03-05 | Disposition: A | Discharge status: Home / Self Care | Source: Ambulatory Visit | Attending: Family Medicine | Admitting: Family Medicine

## 2024-03-05 VITALS — BP 110/70 | HR 72 | Ht 66.0 in | Wt 180.0 lb

## 2024-03-05 DIAGNOSIS — R748 Abnormal levels of other serum enzymes: Secondary | ICD-10-CM

## 2024-03-05 DIAGNOSIS — K219 Gastro-esophageal reflux disease without esophagitis: Secondary | ICD-10-CM | POA: Diagnosis not present

## 2024-03-05 DIAGNOSIS — R1013 Epigastric pain: Secondary | ICD-10-CM | POA: Diagnosis not present

## 2024-03-05 DIAGNOSIS — G8929 Other chronic pain: Secondary | ICD-10-CM

## 2024-03-05 DIAGNOSIS — Z860101 Personal history of adenomatous and serrated colon polyps: Secondary | ICD-10-CM | POA: Diagnosis not present

## 2024-03-05 DIAGNOSIS — M5431 Sciatica, right side: Secondary | ICD-10-CM

## 2024-03-05 NOTE — Patient Instructions (Addendum)
 GERD diet Continue pantoprazole  40 mg twice daily , take 1 capsule 30-45 mins before breakfast and dinner No NSAIDs (motrin)  You have been scheduled for an abdominal ultrasound at [facility] (1st floor of hospital) on 03/13/24 at 8:30am. Please arrive 30 minutes prior to your appointment for registration. Make certain not to have anything to eat or drink 6 hours prior to your appointment. Should you need to reschedule your appointment, please contact radiology at [phone]. This test typically takes about 30 minutes to perform.  Your provider has ordered Diatherix stool testing for you. You have received a kit from our office today containing all necessary supplies to complete this test. Please carefully read the stool collection instructions provided in the kit before opening the accompanying materials. In addition, be sure there is a label providing your full name and date of birth on the puritan opti-swab tube that is supplied in the kit (if you do not see a label with this information on your test tube, please make us  aware before test collection!). After completing the test, you should secure the purtian tube into the specimen biohazard bag. [facility] E-Req sheet (including date and time of specimen collection) should be placed into the outside pocket of the specimen biohazard bag and returned to the [facility] (basement floor of [facility]) within 3 days of collection. Please make sure to give the specimen to a staff member at the lab. DO NOT leave the specimen on the counter.   If the specimen date and time (can be found in the upper right boxed portion of the sheet) are not filled out on the E-Req sheet, the test will NOT be performed.   You have been scheduled for an endoscopy. Please follow written instructions given to you at your visit today.  If you use inhalers (even only as needed), please bring them with you on the day of your  procedure.  If you take any of the following medications, they will need to be adjusted prior to your procedure:   DO NOT TAKE 7 DAYS PRIOR TO TEST- Trulicity (dulaglutide) Ozempic, Wegovy (semaglutide) Mounjaro (tirzepatide) Bydureon Bcise (exanatide extended release)  DO NOT TAKE 1 DAY PRIOR TO YOUR TEST Rybelsus (semaglutide) Adlyxin (lixisenatide) Victoza (liraglutide) Byetta (exanatide) ___________________________________________________________________________  Due to recent changes in healthcare laws, you may see the results of your imaging and laboratory studies on MyChart before your provider has had a chance to review them.  We understand that in some cases there may be results that are confusing or concerning to you. Not all laboratory results come back in the same time frame and the provider may be waiting for multiple results in order to interpret others.  Please give us  48 hours in order for your provider to thoroughly review all the results before contacting the office for clarification of your results.  _______________________________________________________  If your blood pressure at your visit was 140/90 or greater, please contact your primary care physician to follow up on this.  _______________________________________________________  If you are age 53 or older, your body mass index should be between 23-30. Your Body mass index is 29.05 kg/m. If this is out of the aforementioned range listed, please consider follow up with your Primary Care Provider.  If you are age 53 or younger, your body mass index should be between 19-25. Your Body mass index is 29.05 kg/m. If this is out of the aformentioned range listed, please consider follow up with  your Primary Care Provider.   ________________________________________________________  The [facility] providers would like to encourage you to use MYCHART to communicate with providers for non-urgent requests or questions.  Due  to long hold times on the telephone, sending your provider a message by [facility] may be a faster and more efficient way to get a response.  Please allow 48 business hours for a response.  Please remember that this is for non-urgent requests.  _______________________________________________________  [facility] is using a team-based approach to care.  Your team is made up of your doctor and two to three APPS. Our APPS (Nurse Practitioners and Physician Assistants) work with your physician to ensure care continuity for you. They are fully qualified to address your health concerns and develop a treatment plan. They communicate directly with your gastroenterologist to care for you. Seeing the Advanced Practice Practitioners on your physician's team can help you by facilitating care more promptly, often allowing for earlier appointments, access to diagnostic testing, procedures, and other specialty referrals.  Thank you for trusting me with your gastrointestinal care. Deanna May, FNP-C

## 2024-03-05 NOTE — Progress Notes (Signed)
 Chief Complaint:GERD Primary GI Doctor:Dr. San  HPI:  Patient is a  53  year old female patient with past medical history of GERD and arthritis, who was referred to me by Berneta Elsie Sayre,* on 12/30/23 for a evaluation of GERD .    Interval History    Patient presents for evaluation of GERD and epigastric pain. Patient has history of GERD. Patient currently taking pantoprazole  40 mg twice daily. She had been taking once daily, until recent she started to have epigastric pain that radiated to her back. This increased over last 6 mths.  Her PCP increased pantoprazole  to twice a day which helps some. No dysphagia. She states when pain is bad she has nausea. No exposure. No travel. No new medications. No changes in diet.   She takes occasional Advil or tylenol .   She denies blood in stools or dark tarry stools. Denies diarrhea or constipation.   Denies alcohol use.   Former smoker, stopped over 25 years ago.   Surgical history: breast surgeries (tumors), fallopian tubes  Patient's family history includes: sister with GERD  GI procedures: 11/2020 colonoscopy, recall 7 years - Two 4 to 6 mm polyps in the cecum, removed with a cold snare. Resected and retrieved. - Diverticulosis in the ascending colon. - Non- bleeding internal hemorrhoids. - The examined portion of the ileum was normal. 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     ezetimibe  (ZETIA ) 10 MG tablet Take 1 tablet (10 mg total) by mouth daily. 90 tablet 3   glucosamine-chondroitin 500-400 MG tablet Take 1 tablet by mouth 2 (two) times daily.     Omega-3 Fatty Acids (FISH OIL) 300 MG CAPS Take by mouth.     pantoprazole  (PROTONIX ) 40 MG tablet Take 1 tablet (40 mg total) by mouth 2 (two) times daily. 60 tablet 3   pregabalin  (LYRICA ) 50 MG capsule Take 1 capsule (50 mg total) by mouth 2 (two) times daily as needed. 60 capsule 0   No current facility-administered medications for this visit.    Allergies as of 03/05/2024   (No Known Allergies)    Family History  Problem Relation Age of Onset   Arthritis Mother    Diabetes Mother    Hyperlipidemia Mother    Hypertension Mother    Miscarriages / [place] Mother    Arthritis Father    Stroke Father    Asthma Father    Hyperlipidemia Father    Breast cancer Sister 55   Miscarriages /  Stillbirths Sister    Asthma Sister    Thyroid  cancer Sister    Asthma Sister    Arthritis Maternal Grandmother    Heart attack Maternal Grandmother    Early death Maternal Grandfather    Heart attack Maternal Grandfather    Arthritis Paternal Grandmother    Heart attack Paternal Grandmother    Heart attack Paternal Grandfather    Leukemia Niece    Colon cancer Neg Hx    Esophageal cancer Neg Hx    Colon polyps Neg Hx    Rectal cancer Neg Hx    Stomach cancer Neg Hx     Review of Systems:    Constitutional: No weight loss, fever, chills, weakness or fatigue HEENT: Eyes: No change in vision               Ears, Nose, Throat:  No change in hearing or congestion Skin: No rash or itching Cardiovascular: No chest pain, chest pressure or palpitations   Respiratory: No SOB or  cough Gastrointestinal: See HPI and otherwise negative Genitourinary: No dysuria or change in urinary frequency Neurological: No headache, dizziness or syncope Musculoskeletal: No new muscle or joint pain Hematologic: No bleeding or bruising Psychiatric: No history of depression or anxiety    Physical Exam:  Vital signs: BP 110/70   Pulse 72   Ht 5' 6 (1.676 m)   Wt 180 lb (81.6 kg)   LMP 07/08/2020   BMI 29.05 kg/m   Constitutional:   Pleasant  female appears to be in NAD, Well developed, Well nourished, alert and cooperative Throat: Oral cavity and pharynx without inflammation, swelling or lesion.  Respiratory: Respirations even and unlabored. Lungs clear to auscultation bilaterally.   No wheezes, crackles, or rhonchi.  Cardiovascular: Normal S1, S2. Regular rate and rhythm. No peripheral edema, cyanosis or pallor.  Gastrointestinal:  Soft, nondistended, epigastric abdominal tenderness. No rebound or guarding. Normal bowel sounds. No appreciable masses or hepatomegaly. Rectal:  Not performed.  Msk:  Symmetrical without gross deformities. Without edema, no deformity or joint abnormality.  Neurologic:  Alert and  oriented x4;  grossly normal neurologically.  Skin:   Dry and intact without significant lesions or rashes.  RELEVANT LABS AND IMAGING: CBC    Latest Ref Rng & Units 03/10/2023   11:33 AM 08/16/2022    4:27 PM 03/26/2022   10:07 AM  CBC  WBC 4.0 - 10.5 K/uL 6.6  7.7  10.4   Hemoglobin 12.0 - 15.0 g/dL 86.7  86.9  86.8   Hematocrit 36.0 - 46.0 % 40.8  38.7  40.5   Platelets 150.0 - 400.0 K/uL 390.0  426.0  434      CMP     Latest Ref Rng & Units 02/16/2024   11:01 AM 03/10/2023   11:33 AM 08/16/2022    4:27 PM  CMP  Glucose 70 - 99 mg/dL 90  86  89   BUN 6 - 23 mg/dL 13  14  10    Creatinine 0.40 - 1.20 mg/dL 9.17  9.19  9.22   Sodium 135 - 145 mEq/L 139  144  140   Potassium 3.5 - 5.1 mEq/L 4.2  5.1  4.2   Chloride 96 - 112 mEq/L 101  105  102   CO2 19 - 32  mEq/L 27  28  29    Calcium  8.4 - 10.5 mg/dL 89.9  89.6  89.9   Total Protein 6.0 - 8.3 g/dL 7.9  7.3  7.1  7.1   Total Bilirubin 0.2 - 1.2 mg/dL 0.5  0.5  0.3   Alkaline Phos 39 - 117 U/L 115  89  143   AST 0 - 37 U/L 29  18  59   ALT 0 - 35 U/L 51  24  115      Lab Results  Component Value Date   TSH 1.72 03/10/2023     Assessment: Encounter Diagnoses  Name Primary?   Gastroesophageal reflux disease, unspecified whether esophagitis present Yes   Abdominal pain, epigastric    Elevated liver enzymes      53 year old female patient who presents for worsening GERD and epigastric pain that radiates to her back despite being on PPI therapy twice daily. Slightly elevated ALT at 51. Normal AST and Total Bili. Normal PLT. Will go ahead and order RUQ abdominal u/s to r/o gallbladder disease. Will also order h pylori diathereix stool test. As well as upper GI endoscopy to rule out PUD, esophagitis, or Barrett's in [facility] with Dr. San.    History of tubular adenomas, UTD on colon screening next due 11/2027.   Plan: - Reinforced GERD diet , no late meals -Cont Pantoprazole  40mg  twice daily -Order RUQ ultrasound U/S -Order h pylori dathereix stool  -Schedule EGD in [facility] with Dr. San. The risks and benefits of EGD with possible biopsies and esophageal dilation were discussed with the patient who agrees to proceed. -recall colonoscopy 11/2027  Thank you for the courtesy of this consult. Please call me with any questions or concerns.   Aamari West, FNP-C [facility] 03/05/2024, 10:39 AM  Cc: Berneta Elsie Sim DEWAINE

## 2024-03-13 ENCOUNTER — Telehealth: Payer: Self-pay | Admitting: Gastroenterology

## 2024-03-13 ENCOUNTER — Ambulatory Visit: Payer: Self-pay | Admitting: Gastroenterology

## 2024-03-13 ENCOUNTER — Other Ambulatory Visit: Payer: Self-pay | Admitting: Gastroenterology

## 2024-03-13 ENCOUNTER — Ambulatory Visit (HOSPITAL_COMMUNITY)
Admission: RE | Admit: 2024-03-13 | Discharge: 2024-03-13 | Disposition: A | Discharge status: Home / Self Care | Source: Ambulatory Visit | Attending: Gastroenterology | Admitting: Gastroenterology

## 2024-03-13 DIAGNOSIS — R748 Abnormal levels of other serum enzymes: Secondary | ICD-10-CM | POA: Diagnosis present

## 2024-03-13 DIAGNOSIS — A048 Other specified bacterial intestinal infections: Secondary | ICD-10-CM

## 2024-03-13 DIAGNOSIS — K219 Gastro-esophageal reflux disease without esophagitis: Secondary | ICD-10-CM | POA: Diagnosis present

## 2024-03-13 DIAGNOSIS — R1013 Epigastric pain: Secondary | ICD-10-CM | POA: Diagnosis present

## 2024-03-13 MED ORDER — TETRACYCLINE HCL 500 MG PO CAPS: 500.0000 mg | ORAL_CAPSULE | Freq: Four times a day (QID) | ORAL | 0 refills | Status: AC

## 2024-03-13 MED ORDER — PANTOPRAZOLE SODIUM 20 MG PO TBEC: 20.0000 mg | DELAYED_RELEASE_TABLET | Freq: Two times a day (BID) | ORAL | 0 refills | Status: DC

## 2024-03-13 MED ORDER — METRONIDAZOLE 250 MG PO TABS: 250.0000 mg | ORAL_TABLET | Freq: Four times a day (QID) | ORAL | 0 refills | Status: AC

## 2024-03-13 MED ORDER — BISMUTH SUBSALICYLATE 262 MG PO CHEW: 524.0000 mg | CHEWABLE_TABLET | Freq: Four times a day (QID) | ORAL | 0 refills | Status: AC

## 2024-03-13 NOTE — Telephone Encounter (Signed)
 Attempted to reach patient to discuss positive H. pylori stool test results.  Also sent MyChart message

## 2024-03-13 NOTE — Progress Notes (Signed)
 H pylori treatment sent

## 2024-03-13 NOTE — Telephone Encounter (Signed)
 Patient returned call to discuss results Spoke with patient about positive H. pylori stool test and we will go ahead and treat.  Laboratory report shows clarithromycin resistance determinate.  Patient does not have any antibiotic allergies.  Also discussed results of abdominal ultrasound showing gallstones and patient agrees to referral to [facility].  We also briefly discussed fatty liver disease and recommended Mediterranean diet with physical exercise and avoiding alcohol use.  Discuss further at follow-up.  Will have office set up

## 2024-03-15 NOTE — Progress Notes (Signed)
 Agree with the assessment and plan as outlined by [facility], FNP-C.  Carlitos Bottino, DO, [facility]

## 2024-04-02 ENCOUNTER — Encounter: Payer: Self-pay | Admitting: Gastroenterology

## 2024-04-02 NOTE — Progress Notes (Signed)
 Referring Physician:  Berneta Elsie Sayre, MD [address] [place],  [place] [address]  Primary Physician:  Berneta Elsie Sayre, MD  History of Present Illness: 04/09/2024 Melinda Mccormick has a history of GERD, elevated LFTs, elevated LDL cholesterol, migraines.   Melinda Mccormick started with constant LBP with pain in posterior thighs bilaterally 3 months ago. Pain was worse with sitting. Melinda Mccormick had relief with standing. Melinda Mccormick has improved with medications, but still has constant left sided LBP with left posterior thigh pain that is more tolerable. Melinda Mccormick has tightness and pain in her left ankle and foot. No numbness, tingling, or weakness.   GI upset with motrin and tylenol - Melinda Mccormick has endoscopy scheduled for today. PCP recently started her on lyrica . Had some relief with steroids as well.   Tobacco use: Does not smoke.   Bowel/Bladder Dysfunction: none  Conservative measures:  Physical therapy:  has participated in PT 01/04/24-02/02/24-pt stopped going on her own Multimodal medical therapy including regular antiinflammatories:  Ibuprofen, Tylenol , Lyrica , Gabapentin , Prednisone  Injections: no epidural steroid injections  Past Surgery: no spine surgery  Melinda Mccormick has no symptoms of cervical myelopathy.  The symptoms are causing a significant impact on the patient's life.   Review of Systems:  A 10 point review of systems is negative, except for the pertinent positives and negatives detailed in the HPI.  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   ezetimibe  (ZETIA ) 10 MG tablet Take 1 tablet (10 mg total) by mouth daily.   glucosamine-chondroitin 500-400 MG tablet Take 1 tablet by mouth 2 (two) times daily.   Omega-3 Fatty Acids (FISH OIL) 300 MG CAPS Take by mouth.   pantoprazole  (PROTONIX ) 40 MG tablet Take 1 tablet by mouth 2 (two) times daily.   pregabalin  (LYRICA ) 50 MG capsule Take 1 capsule (50 mg total) by mouth 2 (two) times daily as needed.   [DISCONTINUED] pantoprazole  (PROTONIX ) 20 MG tablet Take 1 tablet (20 mg total) by mouth 2 (two) times daily for 14 days.   No facility-administered encounter medications on file as of 04/09/2024.    Social History: Social History   Tobacco Use   Smoking status: Former   Smokeless tobacco: Never   Tobacco comments:    smoke 2 years in college. said 2 cigarettes per day. Over 25 years ago  Vaping Use   Vaping status: Never Used  Substance Use Topics   Alcohol use: Yes    Alcohol/week: 2.0 standard drinks of alcohol    Types: 2 Glasses of wine per week    Comment: 1-2 times per month   Drug use: Never  Family Medical History: Family History  Problem Relation Age of Onset   Arthritis Mother    Diabetes Mother    Hyperlipidemia Mother    Hypertension Mother    Miscarriages / Stillbirths Mother    Arthritis Father    Stroke Father    Asthma Father    Hyperlipidemia Father    Breast cancer Sister 20   Miscarriages / Stillbirths Sister    Asthma Sister    Thyroid  cancer Sister    Asthma Sister    Arthritis Maternal Grandmother    Heart attack Maternal Grandmother    Early death Maternal Grandfather    Heart attack Maternal Grandfather    Arthritis Paternal  Grandmother    Heart attack Paternal Grandmother    Heart attack Paternal Grandfather    Leukemia Niece    Colon cancer Neg Hx    Esophageal cancer Neg Hx    Colon polyps Neg Hx    Rectal cancer Neg Hx    Stomach cancer Neg Hx     Physical Examination: Vitals:   04/09/24 0848  BP: 118/72    General: Patient is well developed, well nourished, calm, collected, and in no apparent distress. Attention to examination is appropriate.  Respiratory: Patient is breathing without any difficulty.   NEUROLOGICAL:     Awake, alert, oriented to person, place, and time.  Speech is clear and fluent. Fund of knowledge is appropriate.   Cranial Nerves: Pupils equal round and reactive to light.  Facial tone is symmetric.    Minimal tenderness left sided lower lumbar spine. Mild tenderness over right SI joint.  No abnormal lesions on exposed skin.   Strength: Side Biceps Triceps Deltoid Interossei Grip Wrist Ext. Wrist Flex.  R 5 5 5 5 5 5 5   L 5 5 5 5 5 5 5    Side Iliopsoas Quads Hamstring PF DF EHL  R 5 5 5 5 5 5   L 5 5 5 5 5 5    Reflexes are 2+ and symmetric at the biceps, brachioradialis, patella and achilles.   Hoffman's is absent.  Clonus is not present.   Bilateral upper and lower extremity sensation is intact to light touch.     Melinda Mccormick can heel/toe stand on right and left foot.   No pain with IR/ER of both hips.   Gait is normal.     Medical Decision Making  Imaging: Lumbar MRI dated 03/05/24:  FINDINGS: Segmentation: 5 lumbar vertebrae. The caudal most well-formed intervertebral disc space is designated L5-S1.   Alignment:  No significant spondylolisthesis.   Vertebrae: No lumbar vertebral compression fracture. No significant marrow edema or focal worrisome marrow lesion. Small Schmorl node within the T12 superior endplate. Multilevel vertebral body hemangiomas.   Conus medullaris and cauda equina: Conus extends to the L1-L2 level. No signal abnormality identified  within the visualized distal spinal cord.   Paraspinal and other soft tissues: No acute finding within included portions of the abdomen/retroperitoneum. No paraspinal mass or collection.   Disc levels:   Mild multilevel disc degeneration.   T12-L1: No significant disc herniation or stenosis.   L1-L2: No significant disc herniation or stenosis.   L2-L3: Small disc bulge. No significant spinal canal or foraminal stenosis.   L3-L4: Disc bulge. Mild facet and ligamentum flavum hypertrophy. No significant spinal canal or foraminal stenosis.   L4-L5: Disc bulge. Superimposed broad-based left subarticular/foraminal disc protrusion (at site of posterior annular fissure). Facet and ligamentum flavum hypertrophy. The disc protrusion contributes to moderate  left subarticular stenosis, posteriorly displacing the descending left L5 nerve root (series 313, image 29). No significant central canal stenosis. Mild left neural foraminal narrowing.   L5-S1: Mild facet arthropathy on the left. No significant disc herniation or stenosis.   Other: Osseous fusion across the right sacroiliac joint, better appreciated on the prior CT angiogram chest/abdomen/pelvis of 03/26/2022.   IMPRESSION: 1. Lumbar spondylosis as outlined within the body of the report. 2. At L4-L5, a broad-based left subarticular/foraminal disc protrusion contributes to moderate left subarticular stenosis (posteriorly displacing the descending left L5 nerve root). Correlate for left L5 radiculopathy. 3. No significant spinal canal or foraminal stenosis elsewhere within the lumbar spine. 4. 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     Electronically Signed   By: Rockey Childs D.O.   On: 03/05/2024 14:58     Lumbar xrays dated 01/04/24:  FINDINGS: There are five non-rib bearing lumbar-type vertebral bodies. There is normal alignment. There is no evidence for acute fracture or subluxation. Intervertebral disc spaces  are relatively preserved. Severe LEFT-sided facet arthropathy at L5-S1. There is moderate RIGHT-sided facet arthropathy at L5-S1. Possible osseous ankylosis of the RIGHT sacroiliac joint   IMPRESSION: 1. Severe LEFT-sided facet arthropathy at L5-S1. 2. Possible osseous ankylosis of the RIGHT sacroiliac joint. This is nonspecific and can be seen in the setting of inflammatory arthropathy or sequela of prior infection.     Electronically Signed   By: Corean Salter M.D.   On: 01/12/2024 18:35  I have personally reviewed the images and agree with the above interpretation.  Assessment and Plan: Melinda Mccormick has a 3 month history of constant LBP with pain in posterior thighs bilaterally that has improved with lyrica  and PT.   Melinda Mccormick still has constant left sided LBP with left posterior thigh pain that is more tolerable. Melinda Mccormick has tightness and pain in her left ankle and foot. No numbness, tingling, or weakness.  Melinda Mccormick has known left foraminal disc L4-L5 with displacement of left L5 nerve and mild left foraminal stenosis. Also with osseous fusion right SI joint.   Left leg/ankle pain likely from L4-L5.   Treatment options discussed with patient and following plan made:   - Continue with HEP from PT. May revisit if pain gets worse.  - Continue on lyrica  from PCP.  - Referral to [facility] in [place] to consider lumbar injections.  - Consider EMG if no improvement with above.  - MyChart visit scheduled with me in 6-8 weeks and prn.   I spent a total of 35 minutes in face-to-face and non-face-to-face activities related to this patient's care today including review of outside records, review of imaging, review of symptoms, physical exam, discussion of differential diagnosis, discussion of treatment options, and documentation.   Thank you for involving me in the care of this patient.   Glade Boys PA-C Dept. of Neurosurgery

## 2024-04-09 ENCOUNTER — Encounter: Payer: Self-pay | Admitting: Gastroenterology

## 2024-04-09 ENCOUNTER — Ambulatory Visit: Admitting: Gastroenterology

## 2024-04-09 ENCOUNTER — Encounter: Payer: Self-pay | Admitting: Orthopedic Surgery

## 2024-04-09 ENCOUNTER — Ambulatory Visit: Admitting: Orthopedic Surgery

## 2024-04-09 VITALS — BP 119/65 | HR 72 | Temp 97.4°F | Resp 13 | Ht 66.0 in | Wt 180.0 lb

## 2024-04-09 VITALS — BP 118/72 | Ht 66.0 in | Wt 176.4 lb

## 2024-04-09 DIAGNOSIS — M5416 Radiculopathy, lumbar region: Secondary | ICD-10-CM

## 2024-04-09 DIAGNOSIS — K2950 Unspecified chronic gastritis without bleeding: Secondary | ICD-10-CM

## 2024-04-09 DIAGNOSIS — M25572 Pain in left ankle and joints of left foot: Secondary | ICD-10-CM | POA: Diagnosis not present

## 2024-04-09 DIAGNOSIS — R748 Abnormal levels of other serum enzymes: Secondary | ICD-10-CM

## 2024-04-09 DIAGNOSIS — K219 Gastro-esophageal reflux disease without esophagitis: Secondary | ICD-10-CM

## 2024-04-09 DIAGNOSIS — B9681 Helicobacter pylori [H. pylori] as the cause of diseases classified elsewhere: Secondary | ICD-10-CM

## 2024-04-09 DIAGNOSIS — M47816 Spondylosis without myelopathy or radiculopathy, lumbar region: Secondary | ICD-10-CM

## 2024-04-09 DIAGNOSIS — M5126 Other intervertebral disc displacement, lumbar region: Secondary | ICD-10-CM | POA: Diagnosis not present

## 2024-04-09 DIAGNOSIS — M48061 Spinal stenosis, lumbar region without neurogenic claudication: Secondary | ICD-10-CM

## 2024-04-09 DIAGNOSIS — K8020 Calculus of gallbladder without cholecystitis without obstruction: Secondary | ICD-10-CM

## 2024-04-09 DIAGNOSIS — R1013 Epigastric pain: Secondary | ICD-10-CM

## 2024-04-09 DIAGNOSIS — R110 Nausea: Secondary | ICD-10-CM

## 2024-04-09 HISTORY — DX: Calculus of gallbladder without cholecystitis without obstruction: K80.20

## 2024-04-09 HISTORY — PX: OTHER SURGICAL HISTORY: SHX169

## 2024-04-09 MED ORDER — SODIUM CHLORIDE 0.9 % IV SOLN: 500.0000 mL | Freq: Once | INTRAVENOUS | Status: DC

## 2024-04-09 NOTE — Op Note (Signed)
 [facility] Patient Name: Melinda Mccormick Procedure Date: 04/09/2024 1:53 PM MRN: [id number] Endoscopist: Sandor Flatter , MD, [id number] Age: 53 Referring MD:  Date of Birth: 10-18-1970 Gender: Female Account #: [id number] Procedure:                Upper GI endoscopy Indications:              Epigastric abdominal pain, Suspected esophageal                            reflux, Nausea Medicines:                Monitored Anesthesia Care Procedure:                Pre-Anesthesia Assessment:                           - Prior to the procedure, a History and Physical                            was performed, and patient medications and                            allergies were reviewed. The patient's tolerance of                            previous anesthesia was also reviewed. The risks                            and benefits of the procedure and the sedation                            options and risks were discussed with the patient.                            All questions were answered, and informed consent                            was obtained. Prior Anticoagulants: The patient has                            taken no anticoagulant or antiplatelet agents. ASA                            Grade Assessment: II - A patient with mild systemic                            disease. After reviewing the risks and benefits,                            the patient was deemed in satisfactory condition to                            undergo the procedure.  After obtaining informed consent, the endoscope was                            passed under direct vision. Throughout the                            procedure, the patient's blood pressure, pulse, and                            oxygen saturations were monitored continuously. The                            Olympus Scope SN [id number] was introduced through the                            mouth, and advanced to the third  part of duodenum.                            The upper GI endoscopy was accomplished without                            difficulty. The patient tolerated the procedure                            well. Scope In: Scope Out: Findings:                 The examined esophagus was normal.                           The Z-line was regular and was found 37 cm from the                            incisors.                           The entire examined stomach was normal. Biopsies                            were taken with a cold forceps for Helicobacter                            pylori testing. Estimated blood loss was minimal.                           The examined duodenum was normal. Complications:            No immediate complications. Estimated Blood Loss:     Estimated blood loss was minimal. Impression:               - Normal esophagus.                           - Z-line regular, 37 cm from the incisors.                           -  Normal stomach. Biopsied.                           - Normal examined duodenum. Recommendation:           - Patient has a contact number available for                            emergencies. The signs and symptoms of potential                            delayed complications were discussed with the                            patient. Return to normal activities tomorrow.                            Written discharge instructions were provided to the                            patient.                           - Resume previous diet.                           - Continue present medications.                           - Await pathology results.                           - Evaluation in the General Surgery clinic for                            cholelithiasis as previously referred.                           - Return to GI clinic at appointment to be                            scheduled.                           - May consider further evaluation with Esophageal                             Manometry and pH/Impedance testing to evaluate for                            breakthrough refluxate despite high dose PPI. Sandor Flatter, MD 04/09/2024 2:25:52 PM

## 2024-04-09 NOTE — Progress Notes (Signed)
 Updated medical record with pt, states she now has been dx with gallstones

## 2024-04-09 NOTE — Progress Notes (Signed)
 Vss nad trans to pacu

## 2024-04-09 NOTE — Progress Notes (Signed)
 Called to room to assist during endoscopic procedure.  Patient ID and intended procedure confirmed with present staff. Received instructions for my participation in the procedure from the performing physician.

## 2024-04-09 NOTE — Patient Instructions (Signed)

## 2024-04-09 NOTE — Progress Notes (Signed)
 GASTROENTEROLOGY PROCEDURE H&P NOTE   Primary Care Physician: Berneta Elsie Sayre, MD    Reason for Procedure:  GERD, epigastric pain  Plan:  EGD  Patient is appropriate for endoscopic procedure(s) in the ambulatory ([facility]) setting.  The nature of the procedure, as well as the risks, benefits, and alternatives were carefully and thoroughly reviewed with the patient. Ample time for discussion and questions allowed. The patient understood, was satisfied, and agreed to proceed.     HPI: Melinda Mccormick is a 53 y.o. female who presents for EGD for evaluation of GERD, epigastric pain, nausea.  Currently on Protonix  40 mg twice daily.  Abdominal ultrasound on 03/13/2024 with hepatic steatosis, cholelithiasis without cholecystitis and referred to [facility].  Otherwise, no significant changes in clinical history since last appointment in the Gastroenterology Clinic on 03/05/2024.  No interval change in medical history since that appointment. Please refer to that note for full details regarding GI history and clinical presentation.   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 Authorizing Provider  Ascorbic Acid (VITAMIN C) 1000 MG tablet Take 1,000 mg by mouth daily.    [provider]  ezetimibe  (ZETIA ) 10 MG tablet Take 1 tablet (10 mg total) by mouth daily. 02/24/24   Berneta Elsie Sayre, MD  glucosamine-chondroitin 500-400 MG tablet Take 1 tablet by mouth 2 (two) times daily.    [provider]  Omega-3 Fatty Acids  (FISH OIL) 300 MG CAPS Take by mouth.    [provider]  pantoprazole  (PROTONIX ) 40 MG tablet Take 1 tablet by mouth 2 (two) times daily. 08/15/19   [provider]  pregabalin  (LYRICA ) 50 MG capsule Take 1 capsule (50 mg total) by mouth 2 (two) times daily as needed. 02/16/24   Berneta Elsie Sayre, MD    Current Outpatient Medications  Medication Sig Dispense Refill   Ascorbic Acid (VITAMIN C) 1000 MG tablet Take 1,000 mg by mouth daily.     ezetimibe  (ZETIA ) 10 MG tablet Take 1 tablet (10 mg total) by mouth daily. 90 tablet 3   glucosamine-chondroitin 500-400 MG tablet Take 1 tablet by mouth 2 (two) times daily.     Omega-3 Fatty Acids (FISH OIL) 300 MG CAPS Take by mouth.     pantoprazole  (PROTONIX ) 40 MG tablet Take 1 tablet by mouth 2 (two) times daily.     pregabalin  (LYRICA ) 50 MG capsule Take 1 capsule (50 mg total) by mouth 2 (two) times daily as needed. 60 capsule 0   Current Facility-Administered Medications  Medication Dose Route Frequency Provider Last Rate Last Admin   0.9 %  sodium chloride  infusion  500 mL Intravenous Once Darriana Deboy V, DO        Allergies as of 04/09/2024 - Review Complete 04/09/2024  Allergen Reaction Noted   Atorvastatin  Other (See Comments) 04/09/2024    Family History  Problem Relation Age of Onset   Arthritis Mother    Diabetes Mother    Hyperlipidemia Mother    Hypertension Mother    Miscarriages / Stillbirths Mother    Arthritis Father    Stroke Father    Asthma Father    Hyperlipidemia Father    Breast cancer Sister 32   Miscarriages / Stillbirths Sister    Asthma Sister    Thyroid  cancer Sister    Asthma Sister    Arthritis Maternal Grandmother    Heart attack Maternal Grandmother    Early death Maternal Grandfather    Heart attack Maternal Grandfather    Arthritis Paternal Grandmother    Heart attack Paternal Grandmother    Heart attack Paternal Grandfather    Leukemia Niece    Colon cancer Neg Hx     Esophageal cancer Neg Hx    Colon polyps Neg Hx    Rectal cancer Neg Hx    Stomach cancer Neg Hx     Social History   Socioeconomic History   Marital status: Significant Other    Spouse name: Not on file   Number of children: Not on file   Years of education: Not on file   Highest education level: Master's degree (e.g., MA, MS, MEng, MEd, MSW, MBA)  Occupational History   Not on file  Tobacco Use   Smoking status: Former   Smokeless tobacco: Never   Tobacco comments:    smoke 2 years in college. said 2 cigarettes per day. Over 25 years ago  Vaping Use   Vaping status: Never Used  Substance and Sexual Activity   Alcohol use: Yes    Alcohol/week: 2.0 standard drinks of alcohol    Types: 2 Glasses of wine per week    Comment: 1-2 times per month   Drug use: Never   Sexual activity: Not on file  Other Topics Concern   Not on file  Social History Narrative   Right Handed    Lives in a one story home    Social Drivers of Health   Financial Resource Strain: Low Risk  (03/09/2023)   Overall Financial Resource Strain (CARDIA)    Difficulty of Paying Living Expenses: Not hard at all  Food Insecurity: No Food Insecurity (03/09/2023)   Hunger Vital Sign    Worried About Running Out of Food in the Last Year: Never true    Ran Out of Food in the Last Year: Never true  Transportation Needs: No Transportation Needs (03/09/2023)   PRAPARE - Administrator, Civil Service (Medical): No    Lack of Transportation (Non-Medical): No  Physical Activity: Insufficiently Active (03/09/2023)   Exercise Vital Sign    Days of Exercise per Week: 1 day    Minutes of Exercise per Session: 60 min  Stress: No Stress Concern Present (03/09/2023)   [employer] of Occupational Health - Occupational Stress Questionnaire    Feeling of Stress : Not at all  Social Connections: Socially Integrated (03/09/2023)   Social Connection and Isolation Panel    Frequency of Communication with  Friends and Family: More than three times a week    Frequency of Social Gatherings with Friends and Family: Once a week    Attends Religious Services: More than 4 times per year    Active Member of [employer] or Organizations: Yes    Attends Engineer, Structural: More than 4 times per year    Marital Status: Living with partner  Intimate  Partner Violence: Not on file    Physical Exam: Vital signs in last 24 hours: @BP  (!) 140/80   Pulse 66   Temp (!) 97.4 F (36.3 C) (Temporal)   Ht 5' 6 (1.676 m)   Wt 180 lb (81.6 kg)   LMP 07/08/2020   SpO2 99%   BMI 29.05 kg/m  GEN: NAD EYE: Sclerae anicteric ENT: MMM CV: Non-tachycardic Pulm: CTA b/l GI: Soft, NT/ND NEURO:  Alert & Oriented x 3   Sandor Flatter, DO [facility]   04/09/2024 1:09 PM

## 2024-04-09 NOTE — Patient Instructions (Signed)
 It was so nice to see you today. Thank you so much for coming in.    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   Continue doing the exercises that you were shown by PT.   Continue on lyrica  from your PCP.   I want you to see [facility] in [place] to discuss possible injections in your lower back. They should call you to schedule an appointment or you can call them at [phone]. They are locted at [address] in [place].   I can also send you here to PMR at [facility] (next to [facility] if the wait is too long to see them. Let me know.   We have a MyChart visit scheduled in January. Please do not hesitate to call if you have any questions or concerns. You can also message me in MyChart.   Glade Boys PA-C [phone]     The physicians and staff at [facility] are committed to providing excellent care. You may receive a survey asking for feedback about your experience at our office. We value you your feedback and appreciate you taking the time to to fill it out. The [facility] leadership team is also available to discuss your experience in person, feel free to contact us  [phone].

## 2024-04-10 ENCOUNTER — Telehealth: Payer: Self-pay

## 2024-04-10 NOTE — Telephone Encounter (Signed)
  Follow up Call-     04/09/2024    1:04 PM  Call back number  Post procedure Call Back phone  # [phone]  Permission to leave phone message Yes     Patient questions:  Do you have a fever, pain , or abdominal swelling? No. Pain Score  0 *  Have you tolerated food without any problems? Yes.    Have you been able to return to your normal activities? Yes.    Do you have any questions about your discharge instructions: Diet   No. Medications  No. Follow up visit  No.  Do you have questions or concerns about your Care? No.  Actions: * If pain score is 4 or above: No action needed, pain <4.

## 2024-04-11 ENCOUNTER — Other Ambulatory Visit: Payer: Self-pay | Admitting: Medical Genetics

## 2024-04-12 ENCOUNTER — Encounter: Admitting: Gastroenterology

## 2024-04-13 ENCOUNTER — Other Ambulatory Visit: Payer: Self-pay

## 2024-04-13 ENCOUNTER — Ambulatory Visit: Payer: Self-pay | Admitting: Gastroenterology

## 2024-04-13 ENCOUNTER — Encounter: Payer: Self-pay | Admitting: Family Medicine

## 2024-04-13 ENCOUNTER — Telehealth: Payer: Self-pay | Admitting: Internal Medicine

## 2024-04-13 DIAGNOSIS — R7401 Elevation of levels of liver transaminase levels: Secondary | ICD-10-CM

## 2024-04-13 DIAGNOSIS — A048 Other specified bacterial intestinal infections: Secondary | ICD-10-CM

## 2024-04-13 DIAGNOSIS — R1013 Epigastric pain: Secondary | ICD-10-CM

## 2024-04-13 DIAGNOSIS — R110 Nausea: Secondary | ICD-10-CM

## 2024-04-13 DIAGNOSIS — K219 Gastro-esophageal reflux disease without esophagitis: Secondary | ICD-10-CM

## 2024-04-13 LAB — SURGICAL PATHOLOGY

## 2024-04-13 MED ORDER — LEVOFLOXACIN 500 MG PO TABS: 500.0000 mg | ORAL_TABLET | Freq: Every day | ORAL | 0 refills | Status: AC

## 2024-04-13 MED ORDER — AMOXICILLIN 250 MG PO CAPS: 750.0000 mg | ORAL_CAPSULE | Freq: Three times a day (TID) | ORAL | 0 refills | Status: AC

## 2024-04-13 MED ORDER — PANTOPRAZOLE SODIUM 40 MG PO TBEC
40.0000 mg | DELAYED_RELEASE_TABLET | Freq: Two times a day (BID) | ORAL | 3 refills | Status: AC
Start: 2024-04-13 — End: ?

## 2024-04-13 NOTE — Addendum Note (Signed)
 Addended by: BENJAMINE NAT DEL on: 04/13/2024 04:19 PM   Modules accepted: Orders

## 2024-04-13 NOTE — Telephone Encounter (Signed)
 Patient called thinking she was to have a third antibiotic.  Explained that she is taking pantoprazole  with Levaquin and amoxicillin .  Clarified that she is to do [employer]athe[employer] stool test for H. pylori 2 weeks after completing antibiotics which is 4 weeks from now.  I think she was instructed to do that test in 2 weeks.  She knows she needs to come pick up the Diathe[employer] test kit.

## 2024-04-17 ENCOUNTER — Encounter: Payer: Self-pay | Admitting: Gastroenterology

## 2024-04-20 ENCOUNTER — Other Ambulatory Visit: Payer: Self-pay | Admitting: Surgery

## 2024-04-30 ENCOUNTER — Telehealth: Payer: Self-pay | Admitting: Gastroenterology

## 2024-04-30 NOTE — Telephone Encounter (Signed)
 Inbound call from patient stating that she would like to speak to the nurse to see if she is needing to keep her appointment scheduled for tomorrow due to her having an appointment for her gall bladder removal. Patient is at work and would like a VM advising her on what she should do. Please advise.

## 2024-04-30 NOTE — Telephone Encounter (Signed)
 Called and informed patient of information below. F/U for tomorrow is cancelled, pt will call back to schedule follow up after surgery recovery. Patient verbalized understanding and had no concerns at the end of the call.

## 2024-04-30 NOTE — Telephone Encounter (Signed)
 Called and informed patient.

## 2024-05-01 ENCOUNTER — Ambulatory Visit: Admitting: Gastroenterology

## 2024-05-15 ENCOUNTER — Encounter (HOSPITAL_COMMUNITY): Payer: Self-pay | Admitting: Surgery

## 2024-05-15 ENCOUNTER — Other Ambulatory Visit: Payer: Self-pay

## 2024-05-15 NOTE — Progress Notes (Signed)
 PCP - Dr Elsie Lent Cardiologist - none  Chest x-ray - n/a EKG - n/a Stress Test - n/a ECHO - n/a Cardiac Cath - n/a  ICD Pacemaker/Loop - n/a  Sleep Study -  n/a  Diabetes - none  Aspirin & Blood Thinner Instructions:  n/a  ERAS - clears until 5:30 AM DOS.  Anesthesia review: no  STOP now taking any Aspirin (unless otherwise instructed by your surgeon), Aleve, Naproxen, Ibuprofen, Motrin, Advil, Goody's, BC's, all herbal medications, fish oil, and all vitamins.   Coronavirus Screening Do you have any of the following symptoms:  Cough yes/no: No Fever (>100.1F)  yes/no: No Runny nose yes/no: No Sore throat yes/no: No Difficulty breathing/shortness of breath  yes/no: No  Have you traveled in the last 14 days and where? yes/no: No  Patient verbalized understanding of instructions that were given via phone.

## 2024-05-15 NOTE — H&P (Signed)
 REFERRING PHYSICIAN: May, Deanna Jean, NP PROVIDER: VICENTA DASIE POLI, MD MRN: [id number] DOB: 1971/01/25  Subjective   Chief Complaint: New Consultation (Gallstones)  History of Present Illness: Melinda Mccormick is a 53 y.o. female who is seen  as an office consultation for evaluation of New Consultation (Gallstones)  This is a pleasant 53 year old female referred here for symptomatic gallstones. She reports that for 3 years she has been having epigastric abdominal pain and hurting due to her back with some nausea and bloating. More recently, she has had some episodes of emesis. She has been seen by gastroenterology and underwent EGD with biopsy. 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 She reports that her discomfort is now worse after spicy and greasy meals. Her sister just recently had a cholecystectomy as well for gallstones. She denies jaundice. She is otherwise healthy and without complaints. She has no cardiopulmonary issues.  Review of Systems: A complete review of systems was obtained from the patient. I have reviewed this information and discussed as appropriate with the patient. See HPI as well for other ROS.  ROS   Medical History: Past Medical History:  Diagnosis Date  Anemia  Gallstones 04/09/2024  GERD (gastroesophageal reflux disease)  Hyperlipidemia  Migraines  Tuberculosis 2009   There is no problem list on file for this patient.  Past Surgical History:  Procedure Laterality Date  Ovarian cyst surgery 10/2019  Breast biopsy (Left)  Breast biopsy (Right)  Breast excisional biopsy (Left)  COLONOSCOPY  SALPINGECTOMY    Allergies  Allergen Reactions  Atorvastatin  Other (See Comments)  Joint pain   Current Outpatient Medications on File Prior to Visit  Medication Sig Dispense Refill  amoxicillin  (AMOXIL ) 250 MG capsule Take 3 capsules (750 mg total) by mouth 3 (three) times daily for 14 days.  ascorbic acid,  vitamin C, (VITAMIN C) 1000 MG tablet Take 1,000 mg by mouth once daily  ascorbic acid/collagen hydr (COLLAGEN SKIN RENEWAL ORAL) Take 2 tablets by mouth 2 (two) times daily  ezetimibe  (ZETIA ) 10 mg tablet Take 10 mg by mouth once daily  glucosamine su 2KCl-chondroit 500-400 mg Tab Take 1 tablet by mouth  levoFLOXacin  (LEVAQUIN ) 500 MG tablet Take 1 tablet (500 mg total) by mouth daily for 14 days.  omega-3 acid ethyl esters (LOVAZA) 1 gram capsule  pantoprazole  (PROTONIX ) 40 MG DR tablet 40 mg by oral route.  pregabalin  (LYRICA ) 50 MG capsule Take 1 capsule (50 mg total) by mouth 2 (two) times daily as needed. (Patient not taking: Reported on 04/20/2024)   No current facility-administered medications on file prior to visit.   Family History  Problem Relation Age of Onset  Arthritis Mother  Miscarriages / Stillbirths Mother  Diabetes Mother  Hyperlipidemia (Elevated cholesterol) Mother  High blood pressure (Hypertension) Mother  Arthritis Father  Stroke Father  Asthma Father  Hyperlipidemia (Elevated cholesterol) Father  Miscarriages / Stillbirths Sister  Thyroid  cancer Sister  Breast cancer Sister  Asthma Sister  Asthma Sister  Arthritis Maternal Grandmother  Myocardial Infarction (Heart attack) Maternal Grandmother  Early death Maternal Grandfather  Myocardial Infarction (Heart attack) Maternal Grandfather  Arthritis Paternal Grandmother  Myocardial Infarction (Heart attack) Paternal Grandmother  Myocardial Infarction (Heart attack) Paternal Grandfather  Leukemia Niece    Social History   Tobacco Use  Smoking Status Former  Types: Cigarettes  Smokeless Tobacco Never    Social History   Socioeconomic History  Marital status: Single  Tobacco Use  Smoking status: Former  Types: Cigarettes  Smokeless tobacco: Never  Vaping Use  Vaping status: Never Used  Substance and Sexual Activity  Alcohol use: Yes  Alcohol/week: 0.0 - 1.0 standard drinks of alcohol  Drug  use: Never  Sexual activity: Defer   Social Drivers of Health   Financial Resource Strain: Low Risk (03/09/2023)  Received from [facility]  Overall Financial Resource Strain (CARDIA)  Difficulty of Paying Living Expenses: Not hard at all  Food Insecurity: No Food Insecurity (03/09/2023)  Received from [facility]  Hunger Vital Sign  Within the past 12 months, you worried that your food would run out before you got the money to buy more.: Never true  Within the past 12 months, the food you bought just didn't last and you didn't have money to get more.: Never true  Transportation Needs: No Transportation Needs (03/09/2023)  Received from [facility] - [facility]  Lack of Transportation (Medical): No  Lack of Transportation (Non-Medical): No  Physical Activity: Insufficiently Active (03/09/2023)  Received from [facility]  Exercise Vital Sign  On average, how many days per week do you engage in moderate to strenuous exercise (like a brisk walk)?: 1 day  On average, how many minutes do you engage in exercise at this level?: 60 min  Stress: No Stress Concern Present (03/09/2023)  Received from [facility]ational Health - Occupational Stress Questionnaire  Feeling of Stress : Not at all  Social Connections: Socially Integrated (03/09/2023)  Received from [facility]  Social Connection and Isolation Panel  In a typical week, how many times do you talk on the phone with family, friends, or neighbors?: More than three times a week  How often do you get together with friends or relatives?: Once a week  How often do you attend church or religious services?: More than 4 times per year  Do you belong to any clubs or organizations such as church groups, unions, fraternal or athletic groups, or school groups?: Yes  How often do you attend meetings of the clubs or organizations you belong to?: More than 4 times per year  Are you married, widowed, divorced,  separated, never married, or living with a partner?: Living with partner  Housing Stability: Unknown (04/20/2024)  Housing Stability Vital Sign  Homeless in the Last Year: No   Objective:   Vitals:   BP: 118/80  Pulse: (!) 117  Temp: 36.6 C (97.8 F)  TempSrc: Temporal  SpO2: 97%  Weight: 80.6 kg (177 lb 9.6 oz)  Height: 167.6 cm (5' 6)  PainSc: 0-No pain   Body mass index is 28.67 kg/m.  Physical Exam   She appears well on exam  Her abdomen is soft. There is mild tenderness to deep palpation of the right upper quadrant. There is no hepatomegaly  Labs, Imaging and Diagnostic Testing: I reviewed her ultrasound showing multiple gallstones. There is no gallbladder wall thickening. The bile duct is 6 mm  Assessment and Plan:   Diagnoses and all orders for this visit:  Symptomatic cholelithiasis   I do believe this is symptomatic cholelithiasis on top of her chronic H. pylori. Her symptoms are very consistent. I recommend proceeding with laparoscopic cholecystectomy. We did also discuss conservative management. She would like to proceed with surgery as well. I gave her literature regarding the surgery and I discussed the surgical procedure in detail. We discussed the risks which includes but is not limited to bleeding, infection, injury to surrounding structures, bile duct  injury, bile leak, the need to convert to an open procedure, cardiopulmonary issues, blood clots, excetra. We discussed postoperative recovery. She understands and wished to proceed with surgery soon as possible

## 2024-05-16 ENCOUNTER — Encounter (HOSPITAL_COMMUNITY): Payer: Self-pay | Admitting: Surgery

## 2024-05-16 ENCOUNTER — Ambulatory Visit (HOSPITAL_COMMUNITY): Payer: Self-pay | Admitting: Registered Nurse

## 2024-05-16 ENCOUNTER — Ambulatory Visit (HOSPITAL_COMMUNITY)
Admission: RE | Admit: 2024-05-16 | Discharge: 2024-05-16 | Disposition: A | Discharge status: Home / Self Care | Attending: Surgery | Admitting: Surgery

## 2024-05-16 ENCOUNTER — Encounter (HOSPITAL_COMMUNITY): Admission: RE | Disposition: A | Payer: Self-pay | Source: Home / Self Care | Attending: Surgery

## 2024-05-16 HISTORY — PX: CHOLECYSTECTOMY: SHX55

## 2024-05-16 HISTORY — DX: Polycystic ovarian syndrome: E28.2

## 2024-05-16 HISTORY — DX: Pneumonia, unspecified organism: J18.9

## 2024-05-16 HISTORY — DX: Inflammatory liver disease, unspecified: K75.9

## 2024-05-16 HISTORY — DX: Fatty (change of) liver, not elsewhere classified: K76.0

## 2024-05-16 LAB — COMPREHENSIVE METABOLIC PANEL WITH GFR
ALT: 141 U/L — ABNORMAL HIGH (ref 0–44)
AST: 82 U/L — ABNORMAL HIGH (ref 15–41)
Albumin: 3.7 g/dL (ref 3.5–5.0)
Alkaline Phosphatase: 190 U/L — ABNORMAL HIGH (ref 38–126)
Anion gap: 10 (ref 5–15)
BUN: 16 mg/dL (ref 6–20)
CO2: 27 mmol/L (ref 22–32)
Calcium: 8.9 mg/dL (ref 8.9–10.3)
Chloride: 99 mmol/L (ref 98–111)
Creatinine, Ser: 0.75 mg/dL (ref 0.44–1.00)
GFR, Estimated: >60 mL/min (ref >60)
Glucose, Bld: 100 mg/dL — ABNORMAL HIGH (ref 70–99)
Potassium: 4 mmol/L (ref 3.5–5.1)
Sodium: 136 mmol/L (ref 135–145)
Total Bilirubin: 0.6 mg/dL (ref 0.0–1.2)
Total Protein: 6.8 g/dL (ref 6.5–8.1)

## 2024-05-16 LAB — CBC
HCT: 39.2 % (ref 36.0–46.0)
Hemoglobin: 12.8 g/dL (ref 12.0–15.0)
MCH: 27.2 pg (ref 26.0–34.0)
MCHC: 32.7 g/dL (ref 30.0–36.0)
MCV: 83.4 fL (ref 80.0–100.0)
Platelets: 387 K/uL (ref 150–400)
RBC: 4.7 MIL/uL (ref 3.87–5.11)
RDW: 14.8 % (ref 11.5–15.5)
WBC: 9.9 K/uL (ref 4.0–10.5)
nRBC: 0 % (ref 0.0–0.2)

## 2024-05-16 SURGERY — LAPAROSCOPIC CHOLECYSTECTOMY
Anesthesia: General

## 2024-05-16 MED ORDER — OXYCODONE HCL 5 MG PO TABS: 5.0000 mg | ORAL_TABLET | Freq: Four times a day (QID) | ORAL | 0 refills | Status: AC | PRN

## 2024-05-16 MED ORDER — ORAL CARE MOUTH RINSE: 15.0000 mL | Freq: Once | OROMUCOSAL | Status: AC

## 2024-05-16 MED ORDER — ONDANSETRON HCL 4 MG/2ML IJ SOLN
INTRAMUSCULAR | Status: AC
  Filled 2024-05-16: qty 2

## 2024-05-16 MED ORDER — ENSURE PRE-SURGERY PO LIQD: 296.0000 mL | Freq: Once | ORAL | Status: DC

## 2024-05-16 MED ORDER — LIDOCAINE 2% (20 MG/ML) 5 ML SYRINGE
INTRAMUSCULAR | Status: DC | PRN
  Administered 2024-05-16: 100 mg via INTRAVENOUS

## 2024-05-16 MED ORDER — ACETAMINOPHEN 10 MG/ML IV SOLN: 1000.0000 mg | Freq: Once | INTRAVENOUS | Status: DC | PRN

## 2024-05-16 MED ORDER — PROPOFOL 10 MG/ML IV BOLUS
INTRAVENOUS | Status: DC | PRN
  Administered 2024-05-16: 200 mg via INTRAVENOUS

## 2024-05-16 MED ORDER — CHLORHEXIDINE GLUCONATE 0.12 % MT SOLN
15.0000 mL | Freq: Once | OROMUCOSAL | Status: AC
  Administered 2024-05-16: 15 mL via OROMUCOSAL
  Filled 2024-05-16: qty 15

## 2024-05-16 MED ORDER — MIDAZOLAM HCL 2 MG/2ML IJ SOLN
INTRAMUSCULAR | Status: AC
  Filled 2024-05-16: qty 2

## 2024-05-16 MED ORDER — PHENYLEPHRINE 80 MCG/ML (10ML) SYRINGE FOR IV PUSH (FOR BLOOD PRESSURE SUPPORT)
PREFILLED_SYRINGE | INTRAVENOUS | Status: AC
  Filled 2024-05-16: qty 10

## 2024-05-16 MED ORDER — PROPOFOL 10 MG/ML IV BOLUS
INTRAVENOUS | Status: AC
  Filled 2024-05-16: qty 20

## 2024-05-16 MED ORDER — SUGAMMADEX SODIUM 200 MG/2ML IV SOLN
INTRAVENOUS | Status: AC
  Filled 2024-05-16: qty 2

## 2024-05-16 MED ORDER — ROCURONIUM BROMIDE 10 MG/ML (PF) SYRINGE
PREFILLED_SYRINGE | INTRAVENOUS | Status: AC
  Filled 2024-05-16: qty 10

## 2024-05-16 MED ORDER — SUGAMMADEX SODIUM 200 MG/2ML IV SOLN
INTRAVENOUS | Status: DC | PRN
  Administered 2024-05-16: 300 mg via INTRAVENOUS

## 2024-05-16 MED ORDER — OXYCODONE HCL 5 MG/5ML PO SOLN: 5.0000 mg | Freq: Once | ORAL | Status: DC | PRN

## 2024-05-16 MED ORDER — FENTANYL CITRATE (PF) 100 MCG/2ML IJ SOLN
INTRAMUSCULAR | Status: AC
  Filled 2024-05-16: qty 2

## 2024-05-16 MED ORDER — BUPIVACAINE-EPINEPHRINE (PF) 0.25% -1:200000 IJ SOLN
INTRAMUSCULAR | Status: AC
  Filled 2024-05-16: qty 30

## 2024-05-16 MED ORDER — LIDOCAINE 2% (20 MG/ML) 5 ML SYRINGE
INTRAMUSCULAR | Status: AC
  Filled 2024-05-16: qty 5

## 2024-05-16 MED ORDER — LACTATED RINGERS IV SOLN: INTRAVENOUS | Status: DC

## 2024-05-16 MED ORDER — ACETAMINOPHEN 500 MG PO TABS
1000.0000 mg | ORAL_TABLET | ORAL | Status: AC
  Administered 2024-05-16: 1000 mg via ORAL
  Filled 2024-05-16: qty 2

## 2024-05-16 MED ORDER — PHENYLEPHRINE 80 MCG/ML (10ML) SYRINGE FOR IV PUSH (FOR BLOOD PRESSURE SUPPORT)
PREFILLED_SYRINGE | INTRAVENOUS | Status: DC | PRN
  Administered 2024-05-16: 80 ug via INTRAVENOUS

## 2024-05-16 MED ORDER — BUPIVACAINE-EPINEPHRINE 0.25% -1:200000 IJ SOLN
INTRAMUSCULAR | Status: DC | PRN
  Administered 2024-05-16: 20 mL

## 2024-05-16 MED ORDER — FENTANYL CITRATE (PF) 100 MCG/2ML IJ SOLN
25.0000 ug | INTRAMUSCULAR | Status: DC | PRN
  Administered 2024-05-16 (×2): 50 ug via INTRAVENOUS

## 2024-05-16 MED ORDER — ROCURONIUM BROMIDE 10 MG/ML (PF) SYRINGE
PREFILLED_SYRINGE | INTRAVENOUS | Status: DC | PRN
  Administered 2024-05-16: 60 mg via INTRAVENOUS

## 2024-05-16 MED ORDER — MIDAZOLAM HCL (PF) 2 MG/2ML IJ SOLN
INTRAMUSCULAR | Status: DC | PRN
  Administered 2024-05-16: 2 mg via INTRAVENOUS

## 2024-05-16 MED ORDER — OXYCODONE HCL 5 MG PO TABS: 5.0000 mg | ORAL_TABLET | Freq: Once | ORAL | Status: DC | PRN

## 2024-05-16 MED ORDER — AMISULPRIDE (ANTIEMETIC) 5 MG/2ML IV SOLN: 10.0000 mg | Freq: Once | INTRAVENOUS | Status: DC | PRN

## 2024-05-16 MED ORDER — ONDANSETRON HCL 4 MG/2ML IJ SOLN: 4.0000 mg | Freq: Once | INTRAMUSCULAR | Status: DC | PRN

## 2024-05-16 MED ORDER — CHLORHEXIDINE GLUCONATE CLOTH 2 % EX PADS: 6.0000 | MEDICATED_PAD | Freq: Once | CUTANEOUS | Status: DC

## 2024-05-16 MED ORDER — CEFAZOLIN SODIUM-DEXTROSE 2-4 GM/100ML-% IV SOLN
2.0000 g | INTRAVENOUS | Status: AC
  Administered 2024-05-16: 2 g via INTRAVENOUS
  Filled 2024-05-16: qty 100

## 2024-05-16 MED ORDER — ONDANSETRON HCL 4 MG/2ML IJ SOLN
INTRAMUSCULAR | Status: DC | PRN
  Administered 2024-05-16: 4 mg via INTRAVENOUS

## 2024-05-16 MED ORDER — FENTANYL CITRATE (PF) 250 MCG/5ML IJ SOLN
INTRAMUSCULAR | Status: DC | PRN
  Administered 2024-05-16 (×2): 50 ug via INTRAVENOUS
  Administered 2024-05-16: 100 ug via INTRAVENOUS

## 2024-05-16 MED ORDER — DEXAMETHASONE SOD PHOSPHATE PF 10 MG/ML IJ SOLN
INTRAMUSCULAR | Status: DC | PRN
  Administered 2024-05-16: 10 mg via INTRAVENOUS

## 2024-05-16 SURGICAL SUPPLY — 28 items
BAG COUNTER SPONGE SURGICOUNT (BAG) ×1 IMPLANT
CANISTER SUCTION 3000ML PPV (SUCTIONS) ×1 IMPLANT
CHLORAPREP W/TINT 26 (MISCELLANEOUS) ×1 IMPLANT
CLIP APPLIE 5 13 M/L LIGAMAX5 (MISCELLANEOUS) ×1 IMPLANT
COVER SURGICAL LIGHT HANDLE (MISCELLANEOUS) ×1 IMPLANT
DERMABOND ADVANCED .7 DNX12 (GAUZE/BANDAGES/DRESSINGS) ×1 IMPLANT
ELECTRODE REM PT RTRN 9FT ADLT (ELECTROSURGICAL) ×1 IMPLANT
GLOVE SURG SIGNA 7.5 PF LTX (GLOVE) ×1 IMPLANT
GOWN STRL REUS W/ TWL LRG LVL3 (GOWN DISPOSABLE) ×2 IMPLANT
GOWN STRL REUS W/ TWL XL LVL3 (GOWN DISPOSABLE) ×1 IMPLANT
IRRIGATION SUCT STRKRFLW 2 WTP (MISCELLANEOUS) ×1 IMPLANT
KIT BASIN OR (CUSTOM PROCEDURE TRAY) ×1 IMPLANT
KIT TURNOVER KIT B (KITS) ×1 IMPLANT
PAD ARMBOARD POSITIONER FOAM (MISCELLANEOUS) ×1 IMPLANT
SCISSORS LAP 5X35 DISP (ENDOMECHANICALS) ×1 IMPLANT
SET TUBE SMOKE EVAC HIGH FLOW (TUBING) ×1 IMPLANT
SLEEVE Z-THREAD 5X100MM (TROCAR) ×2 IMPLANT
SOLN 0.9% NACL POUR BTL 1000ML (IV SOLUTION) ×1 IMPLANT
SOLN STERILE WATER BTL 1000 ML (IV SOLUTION) ×1 IMPLANT
SUT MNCRL AB 4-0 PS2 18 (SUTURE) ×1 IMPLANT
SUT VICRYL 0 UR6 27IN ABS (SUTURE) IMPLANT
SYSTEM BAG RETRIEVAL 10MM (BASKET) ×1 IMPLANT
TOWEL GREEN STERILE (TOWEL DISPOSABLE) ×1 IMPLANT
TOWEL GREEN STERILE FF (TOWEL DISPOSABLE) ×1 IMPLANT
TRAY LAPAROSCOPIC MC (CUSTOM PROCEDURE TRAY) ×1 IMPLANT
TROCAR BALLN 12MMX100 BLUNT (TROCAR) ×1 IMPLANT
TROCAR Z-THREAD OPTICAL 5X100M (TROCAR) ×1 IMPLANT
WARMER LAPAROSCOPE (MISCELLANEOUS) ×1 IMPLANT

## 2024-05-16 NOTE — Anesthesia Preprocedure Evaluation (Addendum)
 Anesthesia Evaluation  Patient identified by MRN, date of birth, ID band Patient awake    Reviewed: Allergy & Precautions, NPO status , Patient's Chart, lab work & pertinent test results  History of Anesthesia Complications Negative for: history of anesthetic complications  Airway Mallampati: I  TM Distance: >3 FB Neck ROM: Full    Dental  (+) Teeth Intact, Dental Advisory Given   Pulmonary former smoker   breath sounds clear to auscultation       Cardiovascular  Rhythm:Regular Rate:Normal  EKG (2025): NSR   Neuro/Psych  Headaches    GI/Hepatic PUD,GERD  Medicated and Controlled,,(+) Hepatitis -Hx of H. Pylori s/p TX   Endo/Other    Renal/GU      Musculoskeletal   Abdominal   Peds  Hematology   Anesthesia Other Findings   Reproductive/Obstetrics                              Anesthesia Physical Anesthesia Plan  ASA: 2  Anesthesia Plan: General   Post-op Pain Management:    Induction: Intravenous  PONV Risk Score and Plan: 3 and Ondansetron , Dexamethasone, Treatment may vary due to age or medical condition and Midazolam   Airway Management Planned: Oral ETT  Additional Equipment: None  Intra-op Plan:   Post-operative Plan: Extubation in OR  Informed Consent:      Dental advisory given  Plan Discussed with: CRNA  Anesthesia Plan Comments:          Anesthesia Quick Evaluation

## 2024-05-16 NOTE — Transfer of Care (Signed)
 Immediate Anesthesia Transfer of Care Note  Patient: Melinda Mccormick  Procedure(s) Performed: LAPAROSCOPIC CHOLECYSTECTOMY  Patient Location: PACU  Anesthesia Type:General  Level of Consciousness: drowsy and patient cooperative  Airway & Oxygen Therapy: Patient connected to face mask oxygen  Post-op Assessment: Report given to RN and Post -op Vital signs reviewed and stable  Post vital signs: Reviewed and stable  Last Vitals:  Vitals Value Taken Time  BP 153/100 05/16/24 09:25  Temp    Pulse 86 05/16/24 09:26  Resp 16 05/16/24 09:26  SpO2 100 % 05/16/24 09:26  Vitals shown include unfiled device data.  Last Pain:  Vitals:   05/16/24 0714  TempSrc:   PainSc: 0-No pain         Complications: There were no known notable events for this encounter.

## 2024-05-16 NOTE — Op Note (Signed)
 LAPAROSCOPIC CHOLECYSTECTOMY  Procedure Note  [name] 05/16/2024   Pre-op Diagnosis: symptomatic gallstones     Post-op Diagnosis: same  Procedure(s): LAPAROSCOPIC CHOLECYSTECTOMY  Surgeon(s): Vernetta Berg, MD  Anesthesia: General  Staff:  Circulator: Prentiss Shona NOVAK, RN Scrub Person: Dyane Greig KATHRYNE Trudy Will KANDICE, RN  Estimated Blood Loss: Minimal               Specimens: sent to path  Findings: The patient was found to have a chronically inflamed appearing gallbladder consistent with chronic cholecystitis with gallstones.  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  She was placed upon the operating table and general esthesia was induced.  Her abdomen was prepped and draped in usual sterile fashion.  I made a vertical incision below the umbilicus with a scalpel.  I took this down to the fascia which was then up with a scalpel as well.  We then gained entrance to the peritoneal cavity under direct vision.  A 0 Vicryl pursestring suture was placed around the fascial opening.  The Donaldson port was placed through the opening and insufflation of the abdomen was again.  Upon insufflating the abdomen and evaluating the right upper quadrant the patient had a moderate amount of Fitz-Hugh Curtis type thin adhesions from the dome of the liver to the diaphragm.  I made an incision in the epigastrium and was able to place a 5 mm trocar that under direct vision and then using laparoscopic scissors take down most of the adhesions.  I then made 2 other incisions in the right upper quadrant with scalpel and placed two 5 mm trocars in the right upper quadrant both under direct vision.  Adhesions to the gallbladder were then taken down with electrocautery and blunt dissection.  I was then able to finally elevate the gallbladder and  retracted above the liver bed.  The base of the gallbladder was dissected out.  I dissected out the cystic duct and a critical view was achieved.  I clipped the cystic duct 3 times proximally once distally and then transected it.  The cystic artery and a posterior branch were then clipped proximally distally and transected as well.  The gallbladder was then slowly dissected free from the liver bed with the electrocautery.  Once it was freed from the liver bed hemostasis appeared to be achieved with the cautery.  The gallbladder was placed in an Endosac and removed the incision at the umbilicus.  0 Vicryl at the umbilicus was then tied in place closing the fascial defect.  The right upper quadrant was then irrigated with saline.  Hemostasis appeared to be achieved.  All ports were then removed under direct vision and the abdomen was deflated.  All incisions were then anesthetized with Marcaine  and closed with 4-0 Monocryl sutures.  Dermabond was then applied.  The patient tolerated the procedure well.  All the counts were correct at the end of the procedure.  The patient was then extubated in the operating room and taken in stable condition to the recovery room.          Berg Vernetta   Date: 05/16/2024  Time: 9:14 AM

## 2024-05-16 NOTE — Interval H&P Note (Signed)
 History and Physical Interval Note:no change in H and P  05/16/2024 7:01 AM  Melinda Mccormick  has presented today for surgery, with the diagnosis of symptomatic gallstones.  The various methods of treatment have been discussed with the patient and family. After consideration of risks, benefits and other options for treatment, the patient has consented to  Procedure(s): LAPAROSCOPIC CHOLECYSTECTOMY (N/A) as a surgical intervention.  The patient's history has been reviewed, patient examined, no change in status, stable for surgery.  I have reviewed the patient's chart and labs.  Questions were answered to the patient's satisfaction.     Vicenta Poli

## 2024-05-16 NOTE — Anesthesia Procedure Notes (Signed)
 Procedure Name: Intubation Date/Time: 05/16/2024 8:31 AM  Performed by: Christopher Comings, CRNAPre-anesthesia Checklist: Patient identified, Emergency Drugs available, Suction available and Patient being monitored Patient Re-evaluated:Patient Re-evaluated prior to induction Oxygen Delivery Method: Circle system utilized Preoxygenation: Pre-oxygenation with 100% oxygen Induction Type: IV induction Ventilation: Mask ventilation without difficulty and Oral airway inserted - appropriate to patient size Laryngoscope Size: Mac and 4 Grade View: Grade I Tube type: Oral Tube size: 7.0 mm Number of attempts: 1 Airway Equipment and Method: Stylet and Oral airway Placement Confirmation: ETT inserted through vocal cords under direct vision, positive ETCO2 and breath sounds checked- equal and bilateral Secured at: 22 cm Tube secured with: Tape Dental Injury: Teeth and Oropharynx as per pre-operative assessment

## 2024-05-16 NOTE — Discharge Instructions (Signed)
[facility] ______[facility] LAPAROSCOPIC SURGERY: POST OP INSTRUCTIONS Always review your discharge instruction sheet given to you by the facility where your surgery was performed. IF YOU HAVE DISABILITY OR FAMILY LEAVE FORMS, YOU MUST BRING THEM TO THE OFFICE FOR PROCESSING.   DO NOT GIVE THEM TO YOUR DOCTOR.  A prescription for pain medication may be given to you upon discharge.  Take your pain medication as prescribed, if needed.  If narcotic pain medicine is not needed, then you may take acetaminophen (Tylenol) or ibuprofen (Advil) as needed. Take your usually prescribed medications unless otherwise directed. If you need a refill on your pain medication, please contact your pharmacy.  They will contact our office to request authorization. Prescriptions will not be filled after 5pm or on week-ends. You should follow a light diet the first few days after arrival home, such as soup and crackers, etc.  Be sure to include lots of fluids daily. Most patients will experience some swelling and bruising in the area of the incisions.  Ice packs will help.  Swelling and bruising can take several days to resolve.  It is common to experience some constipation if taking pain medication after surgery.  Increasing fluid intake and taking a stool softener (such as Colace) will usually help or prevent this problem from occurring.  A mild laxative (Milk of Magnesia or Miralax) should be taken according to package instructions if there are no bowel movements after 48 hours. Unless discharge instructions indicate otherwise, you may remove your bandages 24-48 hours after surgery, and you may shower at that time.  You may have steri-strips (small skin tapes) in place directly over the incision.  These strips should be left on the skin for 7-10 days.  If your surgeon used skin glue on the incision, you may shower in 24 hours.  The glue will flake off over the next 2-3 weeks.  Any sutures or staples will be  removed at the office during your follow-up visit. ACTIVITIES:  You may resume regular (light) daily activities beginning the next day--such as daily self-care, walking, climbing stairs--gradually increasing activities as tolerated.  You may have sexual intercourse when it is comfortable.  Refrain from any heavy lifting or straining until approved by your doctor. You may drive when you are no longer taking prescription pain medication, you can comfortably wear a seatbelt, and you can safely maneuver your car and apply brakes. RETURN TO WORK:  __________________________________________________________ You should see your doctor in the office for a follow-up appointment approximately 2-3 weeks after your surgery.  Make sure that you call for this appointment within a day or two after you arrive home to insure a convenient appointment time. OTHER INSTRUCTIONS: YOU MAY SHOWER STARTING TOMORROW ICE PACK, TYLENOL, AND IBUPROFEN ALSO FOR PAIN NO LIFTING MORE THAN 15 POUNDS FOR 2 WEEKS __________________________________________________________________________________________________________________________ __________________________________________________________________________________________________________________________ WHEN TO CALL YOUR DOCTOR: Fever over 101.0 Inability to urinate Continued bleeding from incision. Increased pain, redness, or drainage from the incision. Increasing abdominal pain  The clinic staff is available to answer your questions during regular business hours.  Please don't hesitate to call and ask to speak to one of the nurses for clinical concerns.  If you have a medical emergency, go to the nearest emergency room or call 911.  A surgeon from [facility] is always on call at the hospital. [address], [address], [place], [place]  [address] ? [address] [address], [place], [place]   [address] [phone] ? [phone] ?   FAX [phone] Web site:  [email or web address] 

## 2024-05-17 ENCOUNTER — Encounter (HOSPITAL_COMMUNITY): Payer: Self-pay | Admitting: Surgery

## 2024-05-17 LAB — SURGICAL PATHOLOGY

## 2024-05-17 NOTE — Anesthesia Postprocedure Evaluation (Signed)
 Anesthesia Post Note  Patient: Melinda Mccormick  Procedure(s) Performed: LAPAROSCOPIC CHOLECYSTECTOMY     Patient location during evaluation: PACU Anesthesia Type: General Level of consciousness: awake Pain management: pain level controlled Vital Signs Assessment: post-procedure vital signs reviewed and stable Respiratory status: spontaneous breathing Cardiovascular status: blood pressure returned to baseline Postop Assessment: no apparent nausea or vomiting Anesthetic complications: no   There were no known notable events for this encounter.  Last Vitals:  Vitals:   05/16/24 1015 05/16/24 1024  BP: 128/87 (!) 121/95  Pulse: 78 78  Resp: 12 13  Temp:  36.7 C  SpO2: 99% 97%                   Malva Diesing T Colhoun

## 2024-05-18 ENCOUNTER — Encounter: Payer: Self-pay | Admitting: Family Medicine

## 2024-05-18 ENCOUNTER — Ambulatory Visit (INDEPENDENT_AMBULATORY_CARE_PROVIDER_SITE_OTHER): Admitting: Family Medicine

## 2024-05-18 VITALS — BP 130/91 | HR 76 | Temp 97.5°F | Resp 16 | Ht 66.0 in | Wt 182.0 lb

## 2024-05-18 DIAGNOSIS — K219 Gastro-esophageal reflux disease without esophagitis: Secondary | ICD-10-CM | POA: Diagnosis not present

## 2024-05-18 DIAGNOSIS — E663 Overweight: Secondary | ICD-10-CM | POA: Diagnosis not present

## 2024-05-18 DIAGNOSIS — K760 Fatty (change of) liver, not elsewhere classified: Secondary | ICD-10-CM | POA: Diagnosis not present

## 2024-05-18 DIAGNOSIS — Z6826 Body mass index (BMI) 26.0-26.9, adult: Secondary | ICD-10-CM | POA: Diagnosis not present

## 2024-05-18 DIAGNOSIS — Z79899 Other long term (current) drug therapy: Secondary | ICD-10-CM | POA: Diagnosis not present

## 2024-05-18 DIAGNOSIS — Z9049 Acquired absence of other specified parts of digestive tract: Secondary | ICD-10-CM

## 2024-05-18 DIAGNOSIS — E7800 Pure hypercholesterolemia, unspecified: Secondary | ICD-10-CM | POA: Diagnosis not present

## 2024-05-18 DIAGNOSIS — Z0000 Encounter for general adult medical examination without abnormal findings: Secondary | ICD-10-CM

## 2024-05-18 NOTE — Progress Notes (Signed)
 Established Patient Office Visit   Subjective:  Patient ID: Melinda Mccormick, female    DOB: May 24, 1971  Age: 53 y.o. MRN: [id number]  Chief Complaint  Patient presents with   Annual Exam    Patient presents today for a physical exam.   Quality Metric Gaps    Pneumococcal vaccine, Hep B vaccines    HPI Encounter Diagnoses  Name Primary?   Healthcare maintenance Yes   Elevated LDL cholesterol level    Overweight with body mass index (BMI) of 26 to 26.9 in adult    Hepatic steatosis    Current use of proton pump inhibitor    S/P laparoscopic cholecystectomy    Gastroesophageal reflux disease, unspecified whether esophagitis present    For physical and follow-up of above.  Status post laparoscopic cholecystectomy 2 days ago.  Melinda Mccormick is recovering nicely.  Pain is diminishing.  Appetite is okay.  Melinda Mccormick is experiencing some constipation.  Will try a stool softener.  Continues follow-up with neurosurgery and physical therapy for HNP L4-L5.  Melinda Mccormick has regular dental care.  Currently not exercising secondary to back and knee issues.  History of ptosis.  Liver enzymes have been mildly elevated.  They were noted to be higher just prior to her recent surgery.   Review of Systems  Constitutional: Negative.   HENT: Negative.    Eyes:  Negative for blurred vision, discharge and redness.  Respiratory: Negative.    Cardiovascular: Negative.   Gastrointestinal:  Negative for abdominal pain.  Genitourinary: Negative.   Musculoskeletal: Negative.  Negative for myalgias.  Skin:  Negative for rash.  Neurological:  Negative for tingling, loss of consciousness and weakness.  Endo/Heme/Allergies:  Negative for polydipsia.    Current Medications[1]   Objective:     BP (!) 130/91   Pulse 76   Temp (!) 97.5 F (36.4 C)   Resp 16   Ht 5' 6 (1.676 m)   Wt 182 lb (82.6 kg)   LMP 07/08/2020   SpO2 99%   BMI 29.38 kg/m    Physical Exam Constitutional:      General: Melinda Mccormick is not in acute  distress.    Appearance: Normal appearance. Melinda Mccormick is not ill-appearing, toxic-appearing or diaphoretic.  HENT:     Head: Normocephalic and atraumatic.     Right Ear: Tympanic membrane, ear canal and external ear normal.     Left Ear: Tympanic membrane, ear canal and external ear normal.     Mouth/Throat:     Mouth: Mucous membranes are moist.     Pharynx: Oropharynx is clear. No oropharyngeal exudate or posterior oropharyngeal erythema.  Eyes:     General: No scleral icterus.       Right eye: No discharge.        Left eye: No discharge.     Extraocular Movements: Extraocular movements intact.     Conjunctiva/sclera: Conjunctivae normal.     Pupils: Pupils are equal, round, and reactive to light.  Cardiovascular:     Rate and Rhythm: Normal rate and regular rhythm.  Pulmonary:     Effort: Pulmonary effort is normal. No respiratory distress.     Breath sounds: Normal breath sounds. No wheezing or rales.  Abdominal:     General: Bowel sounds are normal.     Tenderness: There is no abdominal tenderness. There is no guarding.   Musculoskeletal:     Cervical back: No rigidity or tenderness.  Lymphadenopathy:     Cervical: No cervical adenopathy.  Skin:  General: Skin is warm and dry.  Neurological:     Mental Status: Melinda Mccormick is alert and oriented to person, place, and time.  Psychiatric:        Mood and Affect: Mood normal.        Behavior: Behavior normal.      No results found for any visits on 05/18/24.    The 10-year ASCVD risk score (Arnett DK, et al., 2019) is: 1.4%    Assessment & Plan:   Healthcare maintenance -     Hemoglobin A1c; Future -     Urinalysis, Routine w reflex microscopic  Elevated LDL cholesterol level -     Comprehensive metabolic panel with GFR; Future -     Lipid panel; Future  Overweight with body mass index (BMI) of 26 to 26.9 in adult  Hepatic steatosis -     Comprehensive metabolic panel with GFR; Future  Current use of proton pump  inhibitor -     B12 and Folate Panel; Future  S/P laparoscopic cholecystectomy -     Comprehensive metabolic panel with GFR; Future -     CBC with Differential/Platelet; Future  Gastroesophageal reflux disease, unspecified whether esophagitis present    Return in about 8 weeks (around 07/13/2024), or Please return fasting in 1 week to have labs drawn..  Information was given on health maintenance and disease prevention.  Melinda Mccormick will schedule follow-up with her GYN provider.  Okay to reduce PPI to once daily.  Discussed weaning procedure.  Recommended exercise for at least 30 minutes daily when Melinda Mccormick is able.  Information was given on managing elevated cholesterol.  Information was given on hepatic steatosis.  Briefly discussed using a GLP-1 agonist for treatment.  Melinda Sim Lent, MD    [1]  Current Outpatient Medications:    Ascorbic Acid (VITAMIN C PO), Take 1,000 mg by mouth in the morning., Disp: , Rfl:    Collagen-Vitamin C-Biotin (COLLAGEN PO), Take 1 capsule by mouth in the morning and at bedtime., Disp: , Rfl:    ezetimibe  (ZETIA ) 10 MG tablet, Take 1 tablet (10 mg total) by mouth daily., Disp: 90 tablet, Rfl: 3   GLUCOSAMINE-CHONDROITIN PO, Take 1 tablet by mouth 2 (two) times daily., Disp: , Rfl:    Omega-3 Fatty Acids (FISH OIL PO), Take 1 capsule by mouth in the morning., Disp: , Rfl:    oxyCODONE  (OXY IR/ROXICODONE ) 5 MG immediate release tablet, Take 1 tablet (5 mg total) by mouth every 6 (six) hours as needed for moderate pain (pain score 4-6) or severe pain (pain score 7-10)., Disp: 25 tablet, Rfl: 0   pantoprazole  (PROTONIX ) 40 MG tablet, Take 1 tablet (40 mg total) by mouth 2 (two) times daily., Disp: 90 tablet, Rfl: 3

## 2024-06-05 NOTE — Progress Notes (Unsigned)
" ° °  My Chart Video Visit- Progress Note: Referring Physician:  Berneta Elsie Sayre, MD [address] [place],  [place] [address]  Primary Physician:  Berneta Elsie Sayre, MD  This visit was performed via MyChart/video.   Patient location: home Provider location: working from home  I spent a total of 10 minutes non-face-to-face activities for this visit on the date of this encounter including review of current clinical condition and response to treatment.    Patient has given verbal consent to this MyChart video visit and we reviewed the limitations of a MyChart video visit. Patient wishes to proceed.    Chief Complaint:  follow up  History of Present Illness: Melinda Mccormick is a 53 y.o. female has a history of  GERD, elevated LFTs, elevated LDL cholesterol, migraines.   Last seen by me on 04/09/24 for constant left sided LBP and left posterior thigh pain. 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 Also with osseous fusion right SI joint.   She was to continue with HEP from PT. She was referred to [facility] in [facility] to consider injections.   She had lap chole on 05/16/24.   She had left L4-L5 TF ESI with Dr. Waylon on 05/29/24.   MyChart visit scheduled to check on her progress.   She is doing well after her injection. She has no current LBP or leg pain. Only some mild back discomfort at the end of the day. She still  has tightness and pain in her left ankle and foot- injection did not help this. No numbness, tingling, or weakness.    Tobacco use: Does not smoke.     Conservative measures:  Physical therapy:  has participated in PT 01/04/24-02/02/24-pt stopped going on her own Multimodal medical therapy including regular antiinflammatories:  Ibuprofen, Tylenol , Lyrica , Gabapentin , Prednisone  Injections:  left L4-L5 TF ESI with Dr. Waylon on 05/29/24   Past Surgery: no spine  surgery    The symptoms are causing a significant impact on the patient's life.    Exam: General: Patient is well developed, well nourished, calm, collected, and in no apparent distress. Attention to examination is appropriate.  Respiratory: Patient is breathing without any difficulty.    Awake, alert, oriented to person, place, and time.  Speech is clear and fluent. Fund of knowledge is appropriate.   Imaging: none   Assessment and Plan: [name] is doing well after her injection. She has no current LBP or leg pain. Only some mild back discomfort at the end of the day. She still  has tightness and pain in her left ankle and foot- injection did not help this. No numbness, tingling, or weakness.    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 Also with osseous fusion right SI joint.    Treatment options discussed with patient and following plan made:    - Continue with HEP from PT. May revisit if pain gets worse.  - Continue on lyrica  from PCP.  - Follow up as scheduled with [facility] in [facility] and she will f/u with me prn.    Glade Boys PA-C Neurosurgery "

## 2024-06-06 ENCOUNTER — Encounter

## 2024-06-08 ENCOUNTER — Encounter: Payer: Self-pay | Admitting: Orthopedic Surgery

## 2024-06-08 ENCOUNTER — Telehealth: Admitting: Orthopedic Surgery

## 2024-06-08 DIAGNOSIS — M5126 Other intervertebral disc displacement, lumbar region: Secondary | ICD-10-CM

## 2024-06-08 DIAGNOSIS — M5416 Radiculopathy, lumbar region: Secondary | ICD-10-CM

## 2024-06-08 DIAGNOSIS — M4726 Other spondylosis with radiculopathy, lumbar region: Secondary | ICD-10-CM

## 2024-06-08 DIAGNOSIS — M47816 Spondylosis without myelopathy or radiculopathy, lumbar region: Secondary | ICD-10-CM

## 2024-06-20 ENCOUNTER — Other Ambulatory Visit (INDEPENDENT_AMBULATORY_CARE_PROVIDER_SITE_OTHER)

## 2024-06-20 ENCOUNTER — Telehealth: Payer: Self-pay

## 2024-06-20 DIAGNOSIS — Z79899 Other long term (current) drug therapy: Secondary | ICD-10-CM | POA: Diagnosis not present

## 2024-06-20 DIAGNOSIS — K760 Fatty (change of) liver, not elsewhere classified: Secondary | ICD-10-CM | POA: Diagnosis not present

## 2024-06-20 DIAGNOSIS — Z9049 Acquired absence of other specified parts of digestive tract: Secondary | ICD-10-CM

## 2024-06-20 DIAGNOSIS — E7800 Pure hypercholesterolemia, unspecified: Secondary | ICD-10-CM

## 2024-06-20 DIAGNOSIS — Z0000 Encounter for general adult medical examination without abnormal findings: Secondary | ICD-10-CM

## 2024-06-20 LAB — COMPREHENSIVE METABOLIC PANEL WITH GFR
ALT: 63 U/L — ABNORMAL HIGH (ref 3–35)
AST: 32 U/L (ref 5–37)
Albumin: 4.4 g/dL (ref 3.5–5.2)
Alkaline Phosphatase: 126 U/L — ABNORMAL HIGH (ref 39–117)
BUN: 14 mg/dL (ref 6–23)
CO2: 30 meq/L (ref 19–32)
Calcium: 9.3 mg/dL (ref 8.4–10.5)
Chloride: 103 meq/L (ref 96–112)
Creatinine, Ser: 0.76 mg/dL (ref 0.40–1.20)
GFR: 89.69 mL/min
Glucose, Bld: 95 mg/dL (ref 70–99)
Potassium: 4.3 meq/L (ref 3.5–5.1)
Sodium: 139 meq/L (ref 135–145)
Total Bilirubin: 0.5 mg/dL (ref 0.2–1.2)
Total Protein: 7.4 g/dL (ref 6.0–8.3)

## 2024-06-20 LAB — HEMOGLOBIN A1C: Hgb A1c MFr Bld: 5.8 % (ref 4.6–6.5)

## 2024-06-20 LAB — CBC WITH DIFFERENTIAL/PLATELET
Basophils Absolute: 0.1 K/uL (ref 0.0–0.1)
Basophils Relative: 1.4 % (ref 0.0–3.0)
Eosinophils Absolute: 0.8 K/uL — ABNORMAL HIGH (ref 0.0–0.7)
Eosinophils Relative: 11.2 % — ABNORMAL HIGH (ref 0.0–5.0)
HCT: 39.1 % (ref 36.0–46.0)
Hemoglobin: 12.9 g/dL (ref 12.0–15.0)
Lymphocytes Relative: 41.1 % (ref 12.0–46.0)
Lymphs Abs: 2.9 K/uL (ref 0.7–4.0)
MCHC: 33 g/dL (ref 30.0–36.0)
MCV: 81.2 fl (ref 78.0–100.0)
Monocytes Absolute: 0.4 K/uL (ref 0.1–1.0)
Monocytes Relative: 5.9 % (ref 3.0–12.0)
Neutro Abs: 2.9 K/uL (ref 1.4–7.7)
Neutrophils Relative %: 40.4 % — ABNORMAL LOW (ref 43.0–77.0)
Platelets: 363 K/uL (ref 150.0–400.0)
RBC: 4.82 Mil/uL (ref 3.87–5.11)
RDW: 15.3 % (ref 11.5–15.5)
WBC: 7.1 K/uL (ref 4.0–10.5)

## 2024-06-20 LAB — LIPID PANEL
Cholesterol: 230 mg/dL — ABNORMAL HIGH (ref 28–200)
HDL: 68.2 mg/dL
LDL Cholesterol: 132 mg/dL — ABNORMAL HIGH (ref 10–99)
NonHDL: 161.76
Total CHOL/HDL Ratio: 3
Triglycerides: 148 mg/dL (ref 10.0–149.0)
VLDL: 29.6 mg/dL (ref 0.0–40.0)

## 2024-06-20 LAB — B12 AND FOLATE PANEL
Folate: 14.4 ng/mL
Vitamin B-12: 226 pg/mL (ref 211–911)

## 2024-06-20 NOTE — Telephone Encounter (Unsigned)
 Copied from CRM [id number]. Topic: Clinical - Medication Question >> Jun 20, 2024  9:25 AM Adelita E wrote: Reason for CRM: Patient discussed at last visit with PCP about starting GLP-1 medications, patient would like to start this if PCP approves. Patient uses the [employer] on [address].

## 2024-06-20 NOTE — Telephone Encounter (Signed)
 Patient was in the office for labs and wanted to left Dr. Berneta know that she would like GLP1 as discussed at last office appointment. Informed the doctor is out of office today and I will send for him a message for review and someone will be in contact via phone call or MyChart. Patient stated that if we need to leave a voice message to please do so on her voice mailbox. Confirmed DPR on file with patient and asked if she had other questions for Dr. Berneta. Patient stated no other questions at this time and verbalized understanding.

## 2024-06-21 ENCOUNTER — Ambulatory Visit: Payer: Self-pay | Admitting: Family Medicine

## 2024-06-22 NOTE — Telephone Encounter (Signed)
 My chart message was sent

## 2024-06-28 ENCOUNTER — Telehealth: Admitting: Family Medicine

## 2024-06-28 ENCOUNTER — Encounter: Payer: Self-pay | Admitting: Family Medicine

## 2024-06-28 ENCOUNTER — Ambulatory Visit
Admission: RE | Admit: 2024-06-28 | Discharge: 2024-06-28 | Disposition: A | Discharge status: Home / Self Care | Source: Ambulatory Visit | Attending: Family Medicine | Admitting: Family Medicine

## 2024-06-28 VITALS — Ht 66.0 in | Wt 178.0 lb

## 2024-06-28 DIAGNOSIS — K760 Fatty (change of) liver, not elsewhere classified: Secondary | ICD-10-CM

## 2024-06-28 DIAGNOSIS — R921 Mammographic calcification found on diagnostic imaging of breast: Secondary | ICD-10-CM

## 2024-06-28 DIAGNOSIS — R0683 Snoring: Secondary | ICD-10-CM

## 2024-06-28 MED ORDER — ZEPBOUND 2.5 MG/0.5ML ~~LOC~~ SOAJ: 2.5000 mg | SUBCUTANEOUS | 2 refills | Status: AC

## 2024-06-28 NOTE — Progress Notes (Signed)
 "  Established Patient Office Visit   Subjective:  Patient ID: Melinda Mccormick, female    DOB: 1970-06-15  Age: 54 y.o. MRN: [id number]  Chief Complaint  Patient presents with   Medical Management of Chronic Issues    Discuss GLP medications     HPI Encounter Diagnoses  Name Primary?   Hepatic steatosis Yes   Snores    Virtual encounter to discuss starting a GLP-1 agonist with her history of hepatic steatosis with elevated liver enzymes.  Steatosis confirmed by ultrasound.  She is active physically by walking her dog for 30 minutes daily.  She is currently consuming a healthy diet and cooks at home throughout the week.  She and her husband go out to dinner once weekly.  She does admit to proclivity for sweets.  She does snore mildly.  She is not aware of apneic episodes.   Review of Systems  Constitutional: Negative.   HENT: Negative.    Eyes:  Negative for blurred vision, discharge and redness.  Respiratory: Negative.    Cardiovascular: Negative.   Gastrointestinal:  Negative for abdominal pain.  Genitourinary: Negative.   Musculoskeletal: Negative.  Negative for myalgias.  Skin:  Negative for rash.  Neurological:  Negative for tingling, loss of consciousness and weakness.  Endo/Heme/Allergies:  Negative for polydipsia.    Current Medications[1]   Objective:     Ht 5' 6 (1.676 m)   Wt 178 lb (80.7 kg)   LMP 07/08/2020   BMI 28.73 kg/m  Wt Readings from Last 3 Encounters:  06/28/24 178 lb (80.7 kg)  05/18/24 182 lb (82.6 kg)  05/16/24 172 lb (78 kg)      Physical Exam Constitutional:      General: She is not in acute distress.    Appearance: Normal appearance. She is not ill-appearing, toxic-appearing or diaphoretic.  HENT:     Head: Normocephalic and atraumatic.     Right Ear: External ear normal.     Left Ear: External ear normal.  Eyes:     General: No scleral icterus.       Right eye: No discharge.        Left eye: No discharge.     Extraocular  Movements: Extraocular movements intact.     Conjunctiva/sclera: Conjunctivae normal.  Pulmonary:     Effort: Pulmonary effort is normal. No respiratory distress.  Skin:    General: Skin is warm and dry.  Neurological:     Mental Status: She is alert and oriented to person, place, and time.  Psychiatric:        Mood and Affect: Mood normal.        Behavior: Behavior normal.      No results found for any visits on 06/28/24.    The 10-year ASCVD risk score (Arnett DK, et al., 2019) is: 1.8%    Assessment & Plan:   Hepatic steatosis -     Zepbound ; Inject 2.5 mg into the skin once a week.  Dispense: 2 mL; Refill: 2  Snores -     Pulmonary Visit    Return Follow-up in 3 months after starting tirzepatide ..  Advised her to have at least 30 minutes of regular exercise daily with resistance training or general health and prevention of sarcopenia associated with GLP-1 agonist use.  Discussed the theoretical risk of thyroid  C cancer.  Status post recent cholecystectomy.  She understands that with any severe abdominal pain, she will need an emergency evaluation increased risk for [facility]  and intestinal obstruction.  Discussed the common side effects of nausea and constipation diarrhea.  Will send for apnea evaluation.  Elsie Sim Lent, MD  Virtual Visit via Video Note  I connected with Melinda Mccormick on 06/28/24 at  4:00 PM EST by a video enabled telemedicine application and verified that I am speaking with the correct person using two identifiers.  Location: Patient: alone in her car.  Provider: work   I discussed the limitations of evaluation and management by telemedicine and the availability of in person appointments. The patient expressed understanding and agreed to proceed.  History of Present Illness:    Observations/Objective:   Assessment and Plan:   Follow Up Instructions:    I discussed the assessment and treatment plan with the patient. The  patient was provided an opportunity to ask questions and all were answered. The patient agreed with the plan and demonstrated an understanding of the instructions.   The patient was advised to call back or seek an in-person evaluation if the symptoms worsen or if the condition fails to improve as anticipated.  I provided 25 minutes of non-face-to-face time during this encounter.   Elsie Sim Lent, MD     [1]  Current Outpatient Medications:    Ascorbic Acid (VITAMIN C PO), Take 1,000 mg by mouth in the morning., Disp: , Rfl:    Collagen-Vitamin C-Biotin (COLLAGEN PO), Take 1 capsule by mouth in the morning and at bedtime., Disp: , Rfl:    ezetimibe  (ZETIA ) 10 MG tablet, Take 1 tablet (10 mg total) by mouth daily., Disp: 90 tablet, Rfl: 3   GLUCOSAMINE-CHONDROITIN PO, Take 1 tablet by mouth 2 (two) times daily., Disp: , Rfl:    Omega-3 Fatty Acids (FISH OIL PO), Take 1 capsule by mouth in the morning., Disp: , Rfl:    pantoprazole  (PROTONIX ) 40 MG tablet, Take 1 tablet (40 mg total) by mouth 2 (two) times daily., Disp: 90 tablet, Rfl: 3   tirzepatide  (ZEPBOUND ) 2.5 MG/0.5ML Pen, Inject 2.5 mg into the skin once a week., Disp: 2 mL, Rfl: 2   oxyCODONE  (OXY IR/ROXICODONE ) 5 MG immediate release tablet, Take 1 tablet (5 mg total) by mouth every 6 (six) hours as needed for moderate pain (pain score 4-6) or severe pain (pain score 7-10). (Patient not taking: Reported on 06/28/2024), Disp: 25 tablet, Rfl: 0  "

## 2024-07-04 ENCOUNTER — Telehealth: Payer: Self-pay | Admitting: Pharmacy Technician

## 2024-07-04 ENCOUNTER — Other Ambulatory Visit (HOSPITAL_COMMUNITY): Payer: Self-pay

## 2024-07-04 NOTE — Telephone Encounter (Signed)
 Pharmacy Patient Advocate Encounter  Received notification from [facility] that Prior Authorization for Zepbound  2.5MG /0.5ML pen-injectors  has been APPROVED from 07/04/2024 to 03/01/2025. Ran test claim, Copay is $25.00. This test claim was processed through [facility]- copay amounts may vary at other pharmacies due to pharmacy/plan contracts, or as the patient moves through the different stages of their insurance plan.   PA #/Case ID/Reference #: [id number]  KEY: [id number]
